# Patient Record
Sex: Male | Born: 1935 | Race: Black or African American | Hispanic: No | Smoking: Never smoker
Health system: Southern US, Community
[De-identification: ages and names within clinical notes are randomized; demographics above are authoritative.]

## PROBLEM LIST (undated history)

## (undated) DIAGNOSIS — I10 Essential (primary) hypertension: Secondary | ICD-10-CM

## (undated) DIAGNOSIS — R42 Dizziness and giddiness: Secondary | ICD-10-CM

## (undated) DIAGNOSIS — I4891 Unspecified atrial fibrillation: Secondary | ICD-10-CM

## (undated) DIAGNOSIS — N390 Urinary tract infection, site not specified: Secondary | ICD-10-CM

## (undated) HISTORY — PX: PACEMAKER INSERTION: SHX728

## (undated) HISTORY — PX: HERNIA REPAIR: SHX51

---

## 2016-08-03 DIAGNOSIS — R339 Retention of urine, unspecified: Secondary | ICD-10-CM | POA: Insufficient documentation

## 2016-09-11 DIAGNOSIS — I10 Essential (primary) hypertension: Secondary | ICD-10-CM | POA: Insufficient documentation

## 2016-09-11 DIAGNOSIS — K219 Gastro-esophageal reflux disease without esophagitis: Secondary | ICD-10-CM | POA: Insufficient documentation

## 2016-09-11 DIAGNOSIS — E785 Hyperlipidemia, unspecified: Secondary | ICD-10-CM | POA: Insufficient documentation

## 2016-09-11 DIAGNOSIS — I2510 Atherosclerotic heart disease of native coronary artery without angina pectoris: Secondary | ICD-10-CM | POA: Insufficient documentation

## 2017-08-27 DIAGNOSIS — R001 Bradycardia, unspecified: Secondary | ICD-10-CM | POA: Insufficient documentation

## 2017-08-30 DIAGNOSIS — R42 Dizziness and giddiness: Secondary | ICD-10-CM | POA: Insufficient documentation

## 2018-02-24 DIAGNOSIS — I442 Atrioventricular block, complete: Secondary | ICD-10-CM | POA: Insufficient documentation

## 2018-04-28 DIAGNOSIS — Z9079 Acquired absence of other genital organ(s): Secondary | ICD-10-CM | POA: Insufficient documentation

## 2018-09-26 DIAGNOSIS — Z950 Presence of cardiac pacemaker: Secondary | ICD-10-CM | POA: Insufficient documentation

## 2019-06-20 ENCOUNTER — Telehealth: Payer: Self-pay | Admitting: Nurse Practitioner

## 2019-06-20 NOTE — Telephone Encounter (Signed)
Spoke with patient's daughter Geralynn Rile, regarding Palliative services and she was in agreement with this.  I have scheduled a Telephone Consult for 07/10/19 @ 12:30 PM.

## 2019-07-10 ENCOUNTER — Other Ambulatory Visit: Payer: Self-pay

## 2019-07-10 ENCOUNTER — Other Ambulatory Visit: Payer: Medicare Other | Admitting: Nurse Practitioner

## 2019-07-10 ENCOUNTER — Encounter: Payer: Self-pay | Admitting: Nurse Practitioner

## 2019-07-10 DIAGNOSIS — Z515 Encounter for palliative care: Secondary | ICD-10-CM

## 2019-07-10 NOTE — Progress Notes (Signed)
[facility] Consult Note Telephone: [phone]  Fax: [phone]  PATIENT NAME: Gregg Case. DOB: May 01, 1933 MRN: [id number]  PRIMARY CARE PROVIDER:   Vidal Schwalbe, MD  REFERRING PROVIDER:  Vidal Schwalbe, MD [address] [place],  [place] [address]  RESPONSIBLE PARTY:  Self; daughter Dustan Hyams   Due to the COVID-19 crisis, this visit was done via telemedicine from my office and it was initiated and consent by this patient and or family  I was asked by Dr Bartolo Darter to see [name] for Palliative care consult for [place].   RECOMMENDATIONS and PLAN:  1. ACP; discussed code status and wishes to remain a full code. Agreeable to have blank MOST form and hard choice book to be mailed for review and will revisit at next [facility] visit  2. Palliative care encounter Palliative medicine team will continue to support patient, patient's family, and medical team. Visit consisted of counseling and education dealing with the complex and emotionally intense issues of symptom management and palliative care in the setting of serious and potentially life-threatening illness  I spent 65 minutes providing this consultation,  from 12:30pm to 1:35pm. More than 50% of the time in this consultation was spent coordinating communication.   HISTORY OF PRESENT ILLNESS:  Gregg Case. is a 85 y.o. year old male with multiple medical problems including .History of non-obstructive CAD, bradycardia s/p pacemaker, hypertension, hyperlipidemia, BPH s/p TURP 2018, osteoarthritis, trochanteric bursitis left hip, chronic pain for which he uses Voltaren gel. [facility] note from 46 / 82 / 2019 for face-to-face visit for a hospital bed as  he was not capable of rolling and positioning himself in the bed. Chronic pain which requires assistance with position changes. Use of hospital bed secondary to Patient stability. On 7 / 22 / 2020 bilateral inguinal  hernia masses that he had for 8 months, Dr Iona Beard, Surgeon found in legal hernia Sac with ascites fluid on interim medial surface of the left spermatic cord, lateral to the inferior epigastric blood vessel. A large indirect inguinal hernia Sac extending to the right scrotum it contained colon. Hospice evaluated 6 / 28 / 2020 with physical condition and function declined in the last few months. A walking cane to ambulate. Incontinent bladder, wears adult pull-ups. Able to do most adl's, able to make  himself something to eat, going to the bathroom. He does live with his son. Appetite good, no weight loss, weight 08/2017 weight 186 lbs and 8 / 28 / . I called [name] and his daughter Gregg Case for scheduled telemedicine telephonic palliative care initial visit. Gregg Case and [name] both in agreement. We talked about past medical history in the setting of chronic disease. We talked about natural age progression. We talked about the last time Mr Gales was independent at home, taking care of himself. Gregg Case endorses he does continue to drive, complete his ADL though has had significant difficulty with stability. It takes him a long time to do is pass. He is incontinent of urine. [name] does wear adult diapers. He needs himself and appetite has been good no recent weight loss. 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 We talked about Life review and family dynamics. 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 We talked about stability and limitations. Want to talk about the hospital bed that he received from insurance which is  very uncomfortable and causing him pain with the mattress. We talked about looking into seeing if that can be replaced. Will give test to nurse Navigator palliative care to see if that is an option. We talked about symptoms of pain and shortness of breath what she does not exhibit. We talked about quality of  life. We talked about medical goals of care including aggressive versus conservative versus Comfort Care. Gregg Case endorses that during hospitalization his wishes were initially to be in DNR and then he decided later in the hospitalization that he would want CPR. So he remained a full code. We talked about different scenarios with aggressive interventions including CPR. We talked about the most corpsman hard Choice book. Gregg Case [name] blitter open to reviewing both and will mail copy re-visit at next palliative care visit. We talked about role of palliative care and plan of care. Discuss that will follow up in 4 weeks if needed as he does appear stable or sooner should he declined. Mr. Kuan and Gregg Case both in agreement. Questions answered the satisfaction. Contact information provided. Therapeutic listening and emotional support provided.  Palliative Care was asked to help address goals of care.   CODE STATUS: Full code  PPS: 60% HOSPICE ELIGIBILITY/DIAGNOSIS: TBD  PAST MEDICAL HISTORY: No past medical history on file.  SOCIAL HX:  Social History   Tobacco Use   Smoking status: Not on file  Substance Use Topics   Alcohol use: Not on file    ALLERGIES: Not on File   PERTINENT MEDICATIONS:  No outpatient encounter medications on file as of 07/10/2019.   No facility-administered encounter medications on file as of 07/10/2019.     PHYSICAL EXAM:   Deferred  Masoud Nyce Z Mike Berntsen, NP

## 2019-07-17 ENCOUNTER — Telehealth: Payer: Self-pay

## 2019-07-17 NOTE — Telephone Encounter (Signed)
Phone call placed to PCP office at the request of Palliative NP to request an order for hospital bed. Spoke with Sharyn Lull to request this.

## 2019-08-12 ENCOUNTER — Other Ambulatory Visit: Payer: Self-pay

## 2019-08-12 ENCOUNTER — Other Ambulatory Visit: Payer: Medicare Other | Admitting: Nurse Practitioner

## 2019-09-01 ENCOUNTER — Other Ambulatory Visit: Payer: Medicare Other | Admitting: Primary Care

## 2019-09-01 ENCOUNTER — Other Ambulatory Visit: Payer: Self-pay

## 2019-09-01 DIAGNOSIS — Z515 Encounter for palliative care: Secondary | ICD-10-CM

## 2019-09-01 NOTE — Progress Notes (Signed)
[facility] Consult Note Telephone: [phone]  Fax: [phone]   PATIENT NAME: Gregg Case. [address] [place] [address] [phone] (home)  DOB: Jun 19, 1933 MRN: [id number]  PRIMARY CARE PROVIDER:   Vidal Schwalbe, MD, [address] [place] [address] [phone]  REFERRING PROVIDER:  Vidal Schwalbe, MD [address] [place],  [place] [address] [phone]  RESPONSIBLE PARTY:   Extended Emergency Contact Information Primary Emergency Contact: Geralynn Case Home Phone: [phone] Relation: Daughter  Mr. Reginold Agent: I met with him and his daughter in his home.   ASSESSMENT AND RECOMMENDATIONS:   1. Advance Care Planning/Goals of Care: Goals include to maximize quality of life and symptom management. Advanced directives we discussed who would help him with his healthcare decisions; his daughter who was in attendance states it is she. He states they have talked a little bit about what he wanted . He states at the hospital they asked and he said he didn't want resuscitation the last time he was in the hospital but has now changed his mind. I went over the most form with them and ask them to have a family discussion about wishes and we would discuss on my next visit with them. I will also clarify any questions at that time.   2. Symptom Management:   Urination Alteration in urinary function: Pt described his main issue as being his incontinence. He realizes he's taking two medication's per scar in [place] to help him empty his bladder well. He's not apparently on any anti-spasmodic currently. He is seen by [facility] urology who has offered him a suprapubic catheter. We discussed how he had attempted to do some self cath but got an infection. I was unclear as to the timeline. We did discuss a Foley catheter and perhaps he should trial that before the suprapubic. I asked him to make a follow up appointment with [facility]  urology to explore additional medication options and to discuss this ongoing impact to his quality of life.   Pain:  he complains of a lot of back pain and is just taking OTC's for this. He did complain about his bed being very uncomfortable and I went and inspected it. The bed springs are pushing up to the surface of the mattress so that it would be quite uncomfortable. He also has a piece of plastic over the for comfort and breathability. I have a call put into [facility] in [place] that he obtained the bed from. I am awaiting their call to hopefully rectify and get him a better mattress from [facility] on [address], [phone]  Mobility: He states that he is living in his own home with his son who works during the day but his home at night. He enjoys walking on his property and states he is driving.  3. Family /Caregiver/Community Supports: Lives with son in own home. Wife has passed. Daughter lives in area and helps too.  4. Cognitive / Functional decline:  Alert and oriented x 3. Has fair health literacy. Able to ambulate in home and states he is driving.   5. Follow up Palliative Care Visit: Palliative care will continue to follow for goals of care clarification and symptom management. Return 8 weeks or prn.  I spent 60 minutes providing this consultation,  from 1430 to 1530. More than 50% of the time in this consultation was spent coordinating communication.   HISTORY OF PRESENT  ILLNESS:  Gregg Case. is a 85 y.o. year old male with multiple medical problems including chronic pain, CAD, GERD, HTN, BPH, urge incontinence, HLD. Palliative Care was asked to follow this patient by consultation request of Vidal Schwalbe, MD to help address advance care planning and goals of care. This is a follow up visit.  CODE STATUS: FULL  PPS: 50% HOSPICE ELIGIBILITY/DIAGNOSIS: no  PAST MEDICAL HISTORY:chronic pain, CAD, GERD, HTN, BPH, urge incontinence,  HLD SOCIAL HX:  Social History   Tobacco Use  . Smoking status: Not on file  Substance Use Topics  . Alcohol use: Not on file    ALLERGIES: No Known Allergies   PERTINENT MEDICATIONS:  Outpatient Encounter Medications as of 09/01/2019  Medication Sig  . amLODipine (NORVASC) 5 MG tablet Take 5 mg by mouth daily.  Marland Kitchen apixaban (ELIQUIS) 5 MG TABS tablet Take 5 mg by mouth 2 (two) times daily.  Marland Kitchen atorvastatin (LIPITOR) 40 MG tablet Take 40 mg by mouth daily.  . cetirizine (ZYRTEC) 10 MG tablet Take 10 mg by mouth daily.  . finasteride (PROSCAR) 5 MG tablet Take 5 mg by mouth daily.  Marland Kitchen lisinopril (ZESTRIL) 20 MG tablet Take 20 mg by mouth 2 (two) times daily.  . pantoprazole (PROTONIX) 40 MG tablet Take 40 mg by mouth 2 (two) times daily.  . sucralfate (CARAFATE) 1 g tablet Take 1 g by mouth 2 (two) times daily.  . tamsulosin (FLOMAX) 0.4 MG CAPS capsule Take 0.8 mg by mouth.   No facility-administered encounter medications on file as of 09/01/2019.     PHYSICAL EXAM / ROS:   Current and past weights: current wt unavailable. 2.5 years ago recorded as 203 (92.2 kg), ht is 190 cm, States clothes were loose but getting tighter again. General: NAD, frail appearing, WNWD Cardiovascular: no chest pain reported, no edema  Pulmonary: no cough, no increased SOB, room air Abdomen: appetite good, denies constipation, continent of bowel GU: denies dysuria, incontinent of urine, incomplete bladder emptying MSK:  no joint deformities, ambulatory, no falls Skin: no rashes or wounds reported Neurological: Weakness, endorses back pain  Jason Coop, NP [facility]  COVID-19 PATIENT SCREENING TOOL  Person answering questions: ________[name]___________ _____   1.  Is the patient or any family member in the home showing any signs or symptoms regarding respiratory infection?               Person with Symptom- ____na_______________________  a. Fever                                                                           Yes___ No___          ___________________  b. Shortness of breath                                                    Yes___ No___          ___________________ c. Cough/congestion  Yes___  No___         ___________________ d. Body aches/pains                                                         Yes___ No___        ____________________ e. Gastrointestinal symptoms (diarrhea, nausea)           Yes___ No___        ____________________  2. Within the past 14 days, has anyone living in the home had any contact with someone with or under investigation for COVID-19?    Yes___ No_x_   Person __________________

## 2019-09-04 ENCOUNTER — Other Ambulatory Visit: Payer: Medicare Other | Admitting: Nurse Practitioner

## 2019-09-30 ENCOUNTER — Emergency Department (HOSPITAL_COMMUNITY): Payer: Medicare PPO

## 2019-09-30 ENCOUNTER — Emergency Department (HOSPITAL_COMMUNITY)
Admission: EM | Admit: 2019-09-30 | Discharge: 2019-09-30 | Disposition: A | Discharge status: Home / Self Care | Payer: Medicare PPO | Attending: Emergency Medicine | Admitting: Emergency Medicine

## 2019-09-30 ENCOUNTER — Other Ambulatory Visit: Payer: Self-pay

## 2019-09-30 ENCOUNTER — Encounter (HOSPITAL_COMMUNITY): Payer: Self-pay | Admitting: Emergency Medicine

## 2019-09-30 DIAGNOSIS — N3001 Acute cystitis with hematuria: Secondary | ICD-10-CM | POA: Insufficient documentation

## 2019-09-30 DIAGNOSIS — Z950 Presence of cardiac pacemaker: Secondary | ICD-10-CM | POA: Insufficient documentation

## 2019-09-30 DIAGNOSIS — R339 Retention of urine, unspecified: Secondary | ICD-10-CM | POA: Diagnosis not present

## 2019-09-30 DIAGNOSIS — I10 Essential (primary) hypertension: Secondary | ICD-10-CM | POA: Insufficient documentation

## 2019-09-30 DIAGNOSIS — I4891 Unspecified atrial fibrillation: Secondary | ICD-10-CM | POA: Diagnosis not present

## 2019-09-30 DIAGNOSIS — Z79899 Other long term (current) drug therapy: Secondary | ICD-10-CM | POA: Diagnosis not present

## 2019-09-30 DIAGNOSIS — Z7901 Long term (current) use of anticoagulants: Secondary | ICD-10-CM | POA: Insufficient documentation

## 2019-09-30 DIAGNOSIS — R1110 Vomiting, unspecified: Secondary | ICD-10-CM | POA: Diagnosis present

## 2019-09-30 DIAGNOSIS — E876 Hypokalemia: Secondary | ICD-10-CM | POA: Insufficient documentation

## 2019-09-30 HISTORY — DX: Dizziness and giddiness: R42

## 2019-09-30 HISTORY — DX: Essential (primary) hypertension: I10

## 2019-09-30 HISTORY — DX: Urinary tract infection, site not specified: N39.0

## 2019-09-30 HISTORY — DX: Unspecified atrial fibrillation: I48.91

## 2019-09-30 LAB — URINALYSIS, ROUTINE W REFLEX MICROSCOPIC
Bilirubin Urine: NEGATIVE
Glucose, UA: NEGATIVE mg/dL
Ketones, ur: NEGATIVE mg/dL
Nitrite: NEGATIVE
Protein, ur: 100 mg/dL — AB
RBC / HPF: >50 RBC/hpf — ABNORMAL HIGH (ref 0–5)
Specific Gravity, Urine: 1.015 (ref 1.005–1.030)
WBC, UA: >50 WBC/hpf — ABNORMAL HIGH (ref 0–5)
pH: 6 (ref 5.0–8.0)

## 2019-09-30 LAB — HEPATIC FUNCTION PANEL
ALT: 26 U/L (ref 0–44)
AST: 24 U/L (ref 15–41)
Albumin: 3.5 g/dL (ref 3.5–5.0)
Alkaline Phosphatase: 72 U/L (ref 38–126)
Bilirubin, Direct: 0.2 mg/dL (ref 0.0–0.2)
Indirect Bilirubin: 0.7 mg/dL (ref 0.3–0.9)
Total Bilirubin: 0.9 mg/dL (ref 0.3–1.2)
Total Protein: 6.9 g/dL (ref 6.5–8.1)

## 2019-09-30 LAB — CBC WITH DIFFERENTIAL/PLATELET
Abs Immature Granulocytes: 0.1 10*3/uL — ABNORMAL HIGH (ref 0.00–0.07)
Basophils Absolute: 0 10*3/uL (ref 0.0–0.1)
Basophils Relative: 0 %
Eosinophils Absolute: 0 10*3/uL (ref 0.0–0.5)
Eosinophils Relative: 0 %
HCT: 40.7 % (ref 39.0–52.0)
Hemoglobin: 13.2 g/dL (ref 13.0–17.0)
Immature Granulocytes: 1 %
Lymphocytes Relative: 5 %
Lymphs Abs: 0.5 10*3/uL — ABNORMAL LOW (ref 0.7–4.0)
MCH: 30.9 pg (ref 26.0–34.0)
MCHC: 32.4 g/dL (ref 30.0–36.0)
MCV: 95.3 fL (ref 80.0–100.0)
Monocytes Absolute: 0.7 10*3/uL (ref 0.1–1.0)
Monocytes Relative: 6 %
Neutro Abs: 10.3 10*3/uL — ABNORMAL HIGH (ref 1.7–7.7)
Neutrophils Relative %: 88 %
Platelets: 152 10*3/uL (ref 150–400)
RBC: 4.27 MIL/uL (ref 4.22–5.81)
RDW: 13.2 % (ref 11.5–15.5)
WBC: 11.7 10*3/uL — ABNORMAL HIGH (ref 4.0–10.5)
nRBC: 0 % (ref 0.0–0.2)

## 2019-09-30 LAB — BASIC METABOLIC PANEL
Anion gap: 9 (ref 5–15)
BUN: 18 mg/dL (ref 8–23)
CO2: 26 mmol/L (ref 22–32)
Calcium: 9 mg/dL (ref 8.9–10.3)
Chloride: 107 mmol/L (ref 98–111)
Creatinine, Ser: 1.15 mg/dL (ref 0.61–1.24)
GFR calc Af Amer: >60 mL/min (ref >60)
GFR calc non Af Amer: 58 mL/min — ABNORMAL LOW (ref >60)
Glucose, Bld: 97 mg/dL (ref 70–99)
Potassium: 3 mmol/L — ABNORMAL LOW (ref 3.5–5.1)
Sodium: 142 mmol/L (ref 135–145)

## 2019-09-30 LAB — LIPASE, BLOOD: Lipase: 14 U/L (ref 11–51)

## 2019-09-30 MED ORDER — POTASSIUM CHLORIDE CRYS ER 20 MEQ PO TBCR
40.0000 meq | EXTENDED_RELEASE_TABLET | Freq: Once | ORAL | Status: AC
  Administered 2019-09-30: 16:00:00 40 meq via ORAL
  Filled 2019-09-30: qty 2

## 2019-09-30 MED ORDER — CEPHALEXIN 500 MG PO CAPS: 500.0000 mg | ORAL_CAPSULE | Freq: Four times a day (QID) | ORAL | 0 refills | Status: AC

## 2019-09-30 MED ORDER — CEPHALEXIN 500 MG PO CAPS
500.0000 mg | ORAL_CAPSULE | Freq: Once | ORAL | Status: AC
  Administered 2019-09-30: 500 mg via ORAL
  Filled 2019-09-30: qty 1

## 2019-09-30 MED ORDER — POTASSIUM CHLORIDE CRYS ER 20 MEQ PO TBCR: 20.0000 meq | EXTENDED_RELEASE_TABLET | Freq: Two times a day (BID) | ORAL | 0 refills | Status: AC

## 2019-09-30 MED ORDER — ONDANSETRON HCL 4 MG/2ML IJ SOLN
4.0000 mg | Freq: Once | INTRAMUSCULAR | Status: AC
  Administered 2019-09-30: 4 mg via INTRAVENOUS
  Filled 2019-09-30: qty 2

## 2019-09-30 NOTE — ED Notes (Signed)
Leg bag placed and explained to pt on how to use

## 2019-09-30 NOTE — ED Triage Notes (Signed)
Per son, patient was complaining of chills and shaking last night. He also had one episode of vomiting last night. Patient complaining of left ear pain and was given antibiotics by PCP.

## 2019-09-30 NOTE — ED Notes (Addendum)
Pt admits to emesis x 1.  Pt requesting kidney function to be checked. Pt denies fevers at home.

## 2019-09-30 NOTE — ED Provider Notes (Signed)
[facility] EMERGENCY DEPARTMENT Provider Note   CSN: [id number] Arrival date & time: 09/30/19  1313     History Chief Complaint  Patient presents with  . Emesis    Gregg Case. is a 86 y.o. male with a history of A. fib on Eliquis, hypertension, history of enlarged prostate and UTI which sounds like it was provoked secondary to attempts at home self catheterization secondary to urinary retention presenting with a 1 day history of nausea, emesis x1 and shaking chills which occurred last night but denies having current symptoms.  He denies nausea today, also denies abdominal pain, chest pain, shortness of breath, cough.  He does report chronic postnasal drip requiring coughing up of clear sputum.  He also states had pain in his left ear last week and was placed on amoxicillin by his PCP,which he has completed.  No fevers, also denies constipation or diarrhea.  No known exposures to Covid.  He has had no medications for his presenting symptoms.  The history is provided by the patient, medical records and a relative (son per phone).       Past Medical History:  Diagnosis Date  . Atrial fibrillation ([place])   . Hypertension   . UTI (urinary tract infection)   . Vertigo     There are no problems to display for this patient.   Past Surgical History:  Procedure Laterality Date  . HERNIA REPAIR    . PACEMAKER INSERTION         History reviewed. No pertinent family history.  Social History   Tobacco Use  . Smoking status: Never Smoker  . Smokeless tobacco: Never Used  Substance Use Topics  . Alcohol use: Never  . Drug use: Never    Home Medications Prior to Admission medications   Medication Sig Start Date End Date Taking? Authorizing Provider  amLODipine (NORVASC) 5 MG tablet Take 5 mg by mouth daily.    [provider]  apixaban (ELIQUIS) 5 MG TABS tablet Take 5 mg by mouth 2 (two) times daily.    [provider]  atorvastatin (LIPITOR) 40 MG  tablet Take 40 mg by mouth daily.    [provider]  cetirizine (ZYRTEC) 10 MG tablet Take 10 mg by mouth daily.    [provider]  finasteride (PROSCAR) 5 MG tablet Take 5 mg by mouth daily.    [provider]  lisinopril (ZESTRIL) 20 MG tablet Take 20 mg by mouth 2 (two) times daily.    [provider]  pantoprazole (PROTONIX) 40 MG tablet Take 40 mg by mouth 2 (two) times daily.    [provider]  sucralfate (CARAFATE) 1 g tablet Take 1 g by mouth 2 (two) times daily.    [provider]  tamsulosin (FLOMAX) 0.4 MG CAPS capsule Take 0.8 mg by mouth.    [provider]  traMADol (ULTRAM) 50 MG tablet Take 1 tablet by mouth 3 (three) times daily as needed. 08/27/19   [provider]    Allergies    Patient has no known allergies.  Review of Systems   Review of Systems  Constitutional: Positive for chills. Negative for fever.  HENT: Negative for congestion and sore throat.   Eyes: Negative.   Respiratory: Negative for chest tightness and shortness of breath.   Cardiovascular: Negative for chest pain.  Gastrointestinal: Positive for nausea and vomiting. Negative for abdominal distention and abdominal pain.  Genitourinary: Positive for enuresis. Negative for decreased urine volume,  dysuria and hematuria.  Musculoskeletal: Negative for arthralgias, joint swelling and neck pain.  Skin: Negative.  Negative for rash and wound.  Neurological: Negative for dizziness, weakness, light-headedness, numbness and headaches.  Psychiatric/Behavioral: Negative.     Physical Exam Updated Vital Signs BP (!) 142/72   Pulse 64   Temp 99.3 F (37.4 C) (Oral)   Ht 6\' 2"  (1.88 m)   Wt 81.6 kg   SpO2 96%   BMI 23.11 kg/m   Physical Exam Vitals and nursing note reviewed.  Constitutional:      Appearance: He is well-developed.  HENT:     Head: Normocephalic and atraumatic.  Eyes:     Conjunctiva/sclera: Conjunctivae normal.    Cardiovascular:     Rate and Rhythm: Normal rate and regular rhythm.     Heart sounds: Normal heart sounds.  Pulmonary:     Effort: Pulmonary effort is normal.     Breath sounds: Normal breath sounds. No wheezing.  Abdominal:     General: Bowel sounds are normal.     Palpations: Abdomen is soft.     Tenderness: There is no abdominal tenderness.  Musculoskeletal:        General: Normal range of motion.     Cervical back: Normal range of motion.  Skin:    General: Skin is warm and dry.  Neurological:     Mental Status: He is alert.     ED Results / Procedures / Treatments   Labs (all labs ordered are listed, but only abnormal results are displayed) Labs Reviewed  CBC WITH DIFFERENTIAL/PLATELET - Abnormal; Notable for the following components:      Result Value   WBC 11.7 (*)    Neutro Abs 10.3 (*)    Lymphs Abs 0.5 (*)    Abs Immature Granulocytes 0.10 (*)    All other components within normal limits  BASIC METABOLIC PANEL - Abnormal; Notable for the following components:   Potassium 3.0 (*)    GFR calc non Af Amer 58 (*)    All other components within normal limits  HEPATIC FUNCTION PANEL  LIPASE, BLOOD  URINALYSIS, ROUTINE W REFLEX MICROSCOPIC    EKG None  Radiology Acute Abd Series  Result Date: 09/30/2019 CLINICAL DATA:  Chills, nausea and vomiting. EXAM: DG ABDOMEN ACUTE W/ 1V CHEST COMPARISON:  None. FINDINGS: There is a dual lead AICD. There is no evidence of acute infiltrate, pleural effusion or pneumothorax. There is no evidence of dilated bowel loops or free intraperitoneal air. No radiopaque calculi or other significant radiographic abnormality is seen. There is marked severity tortuosity of the descending thoracic aorta. Heart size and mediastinal contours are within normal limits. Marked severity degenerative changes seen throughout the thoracolumbar spine. IMPRESSION: 1. No acute or active cardiopulmonary disease. 2. No evidence of bowel obstruction.  Electronically Signed   By: 11/28/2019 M.D.   On: 09/30/2019 15:35    Procedures Procedures (including critical care time)  Medications Ordered in ED Medications  ondansetron (ZOFRAN) injection 4 mg (4 mg Intravenous Given 09/30/19 1459)  potassium chloride SA (KLOR-CON) CR tablet 40 mEq (40 mEq Oral Given 09/30/19 1629)    ED Course  I have reviewed the triage vital signs and the nursing notes.  Pertinent labs & imaging results that were available during my care of the patient were reviewed by me and considered in my medical decision making (see chart for details).    MDM Rules/Calculators/A&P  Labs reviewed and unremarkable except for hypokalemia at 3.0.  He was ordered p.o. supplementation of this.  He also does have a mild leukocytosis at 11.7.  Currently the patient has only been able to give [place] a small sample of urine, roughly 20 cc, however a bladder scan indicates 422 cc of urine, concerning for urinary retention.  Foley catheter was ordered.  Pending urinalysis results at this time.  The urine sample is dark and cloudy, concern for possible UTI.  Patient may be best served with going home with a Foley catheter with close urology follow-up given concern for your significant urinary retention.  Discussed with Gregg Aus, PA-C who assumes care of patient. Final Clinical Impression(s) / ED Diagnoses Final diagnoses:  Urinary retention  Hypokalemia    Rx / DC Orders ED Discharge Orders    None       Victoriano Lain 09/30/19 1705    Raeford Razor, MD 10/03/19 1007

## 2019-09-30 NOTE — ED Notes (Signed)
Patient's son states he will wait in car and wants to be contacted if needed. Name is Kervin Bones, number is [phone].

## 2019-09-30 NOTE — ED Notes (Signed)
Patient transported to X-ray 

## 2019-09-30 NOTE — Discharge Instructions (Addendum)
Your urine test today shows that you have a urinary tract infection.  Please take the antibiotic as directed until it is finished.  You also have some urinary retention, so the Foley catheter will be left in place till you can follow-up with your urologist this week.  Your potassium level today was also low.  You may take the potassium supplements as directed for 5 days.  You may have your primary provider to recheck your potassium level in a week.  Return to the emergency room for any worsening symptoms.

## 2019-09-30 NOTE — ED Provider Notes (Signed)
   Patient is a 86 year old male with history of atrial fibrillation and anticoagulated on Eliquis, enlarged prostate and urinary tract infection.  He comes to the emergency department with a 1 day history of chills and one episode of vomiting.  He was noted to have urinary retention and a Foley catheter was ordered.  Urinalysis and laboratory studies were pending at end of shift, signed out by Burgess Amor, PA-C  Patient reports feeling better after insertion of Foley catheter.  Catheter is draining well and approximately 450 cc of dark urine in the Foley bag.  Patient appears appropriate for discharge home, urine culture is pending.  Will start on Keflex.  I have spoken with the patient's daughter and son and explained follow-up instructions.  They agree to follow-up with patient's urologist this week.  Will also start him on short course of potassium for his hypokalemia.     Labs Reviewed  CBC WITH DIFFERENTIAL/PLATELET - Abnormal; Notable for the following components:      Result Value   WBC 11.7 (*)    Neutro Abs 10.3 (*)    Lymphs Abs 0.5 (*)    Abs Immature Granulocytes 0.10 (*)    All other components within normal limits  BASIC METABOLIC PANEL - Abnormal; Notable for the following components:   Potassium 3.0 (*)    GFR calc non Af Amer 58 (*)    All other components within normal limits  URINALYSIS, ROUTINE W REFLEX MICROSCOPIC - Abnormal; Notable for the following components:   Color, Urine AMBER (*)    APPearance CLOUDY (*)    Hgb urine dipstick LARGE (*)    Protein, ur 100 (*)    Leukocytes,Ua LARGE (*)    RBC / HPF >50 (*)    WBC, UA >50 (*)    Bacteria, UA RARE (*)    All other components within normal limits  URINE CULTURE  HEPATIC FUNCTION PANEL  LIPASE, BLOOD      Pauline Aus, PA-C 09/30/19 1843    Bethann Berkshire, MD 10/01/19 1038

## 2019-10-02 LAB — URINE CULTURE: Culture: <10000 — AB

## 2019-10-09 ENCOUNTER — Telehealth: Payer: Self-pay | Admitting: Primary Care

## 2019-10-09 NOTE — Telephone Encounter (Signed)
T/c from Daughter Santina Evans about visit tomorrow. She gave me some history about his recent urology work up and issues. They are determining what path to follow to deal with incontinence. In addition, his bed with the sprung springs was not replaced by [facility]. I phoned and left another message RE replacing the mattress. Santina Evans would like me to phone tomorrow during my visit to have her on speaker.

## 2019-10-10 ENCOUNTER — Other Ambulatory Visit: Payer: Medicare PPO | Admitting: Primary Care

## 2019-10-10 ENCOUNTER — Other Ambulatory Visit: Payer: Self-pay

## 2019-10-14 ENCOUNTER — Telehealth: Payer: Self-pay | Admitting: Primary Care

## 2019-10-14 NOTE — Telephone Encounter (Signed)
I made a call on behalf of patient to [employer] on October 10, 2019,voice number [phone] to find out about the bed mattress. This mattress has been defective for some months with the spring pushing up. They stated that they had not received my calls asking for a replacement in the past.   However Mr. Felmlee is now eligible for a new mattress which is available through Medicare yearly. His date of original purchase was 09-19-18 so he can get a new bed mattress now. PCP should send order for the new mattress to fit the existing bed. Please also order a gel overlay.  He has new insurance for 2021 which needs to also be updated and included. I have asked the family to obtain a copy of his Medicare card front and back for this purpose. There need to be notes as to why he needs the new mattress. I am happy to document this on my next visit if a face-to-face is needed. Otherwise he needs the mattress because the existing one is damaged and has bedsprings dislodged and pushing up on the  mattress surface. The DME company did not return my calls to replace the damaged one during the first year.   Fax number to send this order is [phone]. I spoke with Amil Amen at [facility] initially, and then [place]. Follow up with Fleet Contras for any problems.

## 2019-10-16 ENCOUNTER — Other Ambulatory Visit: Payer: Self-pay

## 2019-10-16 ENCOUNTER — Other Ambulatory Visit: Payer: Medicare PPO | Admitting: Primary Care

## 2019-10-16 DIAGNOSIS — Z515 Encounter for palliative care: Secondary | ICD-10-CM

## 2019-10-16 NOTE — Progress Notes (Signed)
[facility] Consult Note Telephone: [phone]  Fax: [phone]  PATIENT NAME: Gregg Case. [address] [place] [address] [phone] (home)  DOB: 08/26/1933 MRN: [id number]  PRIMARY CARE PROVIDER:   Vidal Schwalbe, MD, [address] [place] [address] [phone]  REFERRING PROVIDER:  Vidal Schwalbe, MD [address] [place],  [place] [address] [phone]  RESPONSIBLE PARTY:   Extended Emergency Contact Information Primary Emergency Contact: Gregg Case Home Phone: [phone] Relation: Daughter Secondary Emergency Contact: Gregg Case Mobile Phone: [phone] Relation: Son  ASSESSMENT AND RECOMMENDATIONS:   1. Advance Care Planning/Goals of Care: Goals include to maximize quality of life and symptom management. Met with patient today, no other family stayed with [place] during interview. States he has had some urology issues and has been in and out of [place], [place] and [place]. Reminded of MOST form I left, they have not reviewed yet. Left copies for them to review.  2. Symptom Management:    Urine function: States some s/sx rigors recently, was treated for this last week. Had a cysto last week. States he is taking medications to help with urination.Daughter states he has had some catheter leakage but it's better now.  Constipation: Has miralax and a new Bullet for juicing. Daughter encourages him to use it.  Pain: In left hip, states chronic hip and back pain. Likely arthritic. Uses linaments on it and it helps. Occ takes tramadol.Suggested acetaminophen as well.   Skin Cream: Needs to obtain for flaking skin. Daughter understands which product to get.   3. Family /Caregiver/Community Supports: Lives in own home with son and other children helping.   4. Cognitive / Functional decline:  At cognitive baseline. Able to do alds and is driving.    5. Follow up Palliative Care Visit:  Palliative care will continue to follow for goals of care clarification and symptom management. Return 6 weeks or prn.  I spent 60 minutes providing this consultation,  from 1515 to 1615. More than 50% of the time in this consultation was spent coordinating communication.   HISTORY OF PRESENT ILLNESS:  Gregg Case. is a 85 y.o. year old male with multiple medical problems including chronic pain, CAD, GERD, HTN, BPH, urge incontinence, HLD. Palliative Care was asked to follow this patient by consultation request of Vidal Schwalbe, MD to help address advance care planning and goals of care. This is a follow up visit.   CODE STATUS: TBD  PPS: 40% HOSPICE ELIGIBILITY/DIAGNOSIS: TBD  PAST MEDICAL HISTORY:  Past Medical History:  Diagnosis Date  . Atrial fibrillation ([place])   . Hypertension   . UTI (urinary tract infection)   . Vertigo     SOCIAL HX:  Social History   Tobacco Use  . Smoking status: Never Smoker  . Smokeless tobacco: Never Used  Substance Use Topics  . Alcohol use: Never    ALLERGIES: No Known Allergies   PERTINENT MEDICATIONS:  Outpatient Encounter Medications as of 10/16/2019  Medication Sig  . amLODipine (NORVASC) 5 MG tablet Take 5 mg by mouth daily.  Marland Kitchen apixaban (ELIQUIS) 5 MG TABS tablet Take 5 mg by mouth 2 (two) times daily.  Marland Kitchen atorvastatin (LIPITOR) 40 MG tablet Take 40 mg by mouth daily.  . cephALEXin (KEFLEX) 500 MG capsule Take 1 capsule (500 mg total) by mouth 4 (four) times daily.  . cetirizine (ZYRTEC) 10 MG tablet Take 10 mg by mouth daily.  . finasteride (PROSCAR) 5 MG tablet  Take 5 mg by mouth daily.  Marland Kitchen lisinopril (ZESTRIL) 20 MG tablet Take 20 mg by mouth 2 (two) times daily.  . pantoprazole (PROTONIX) 40 MG tablet Take 40 mg by mouth 2 (two) times daily.  . potassium chloride SA (KLOR-CON) 20 MEQ tablet Take 1 tablet (20 mEq total) by mouth 2 (two) times daily.  . sucralfate (CARAFATE) 1 g tablet Take 1 g by mouth 2 (two) times daily.  .  tamsulosin (FLOMAX) 0.4 MG CAPS capsule Take 0.8 mg by mouth.  . traMADol (ULTRAM) 50 MG tablet Take 1 tablet by mouth 3 (three) times daily as needed.   No facility-administered encounter medications on file as of 10/16/2019.    PHYSICAL EXAM / ROS:   Current and past weights: States he feels he is stable with weight  General: NAD, frail appearing,  WNWD Cardiovascular: no chest pain reported, no edema Pulmonary: no cough, no increased SOB, room air Abdomen: appetite good, endorses constipation, continent of bowel GU: denies dysuria, incontinent of urine, incomplete emptying MSK:  no joint deformities, ambulatory with cane in home, Has a walker. Prefers cane. Denies falls.  Skin: no rashes or wounds reported, dryness of LE due to sl edema. Has inexpensive lotion, needs cream for skin. Neurological: Weakness,  Chronic pain reported  Jason Coop, NP  COVID-19 PATIENT SCREENING TOOL  Person answering questions: ________Self___________ _____   1.  Is the patient or any family member in the home showing any signs or symptoms regarding respiratory infection?               Person with Symptom- ___________________________  a. Fever                                                                          Yes___ No___          ___________________  b. Shortness of breath                                                    Yes___ No___          ___________________ c. Cough/congestion                                       Yes___  No___         ___________________ d. Body aches/pains                                                         Yes___ No___        ____________________ e. Gastrointestinal symptoms (diarrhea, nausea)           Yes___ No___        ____________________  2. Within the past 14 days, has anyone living in the home had any contact with someone with or under  investigation for COVID-19?    Yes___ No_x_   Person __________________

## 2019-10-27 ENCOUNTER — Telehealth: Payer: Self-pay | Admitting: Primary Care

## 2019-10-27 NOTE — Telephone Encounter (Signed)
T/c to home to check on the mattress. No delivery yet, will call pcp and request.

## 2019-11-04 ENCOUNTER — Other Ambulatory Visit: Payer: Medicare Other | Admitting: Primary Care

## 2019-11-25 ENCOUNTER — Telehealth: Payer: Self-pay | Admitting: Primary Care

## 2019-11-25 NOTE — Telephone Encounter (Signed)
T/c to home to schedule palliative visit. No answer, no message left.

## 2019-12-04 ENCOUNTER — Telehealth: Payer: Self-pay | Admitting: Primary Care

## 2019-12-04 NOTE — Telephone Encounter (Signed)
T/c to family, mattress is still not received. Call to PCP office to inquire RE order for new mattress. Spoke with Dr Bryna Colander who will follow up . Patient has yearly benefit for new mattress for his hospital bed. He just needs the mattress due to spring malfunction in his current bed. We've sent requests for this to [facility] multiple times.

## 2019-12-09 ENCOUNTER — Other Ambulatory Visit: Payer: Medicare PPO | Admitting: Primary Care

## 2019-12-09 ENCOUNTER — Other Ambulatory Visit: Payer: Self-pay

## 2019-12-09 DIAGNOSIS — Z515 Encounter for palliative care: Secondary | ICD-10-CM

## 2019-12-09 NOTE — Progress Notes (Signed)
Therapist, nutritional Palliative Care Consult Note Telephone: [phone]  Fax: [phone]    PATIENT NAME: Gregg Case. [address] [place] [address] [phone] (home)  DOB: [birth year] MRN: [id number]  PRIMARY CARE PROVIDER:   Emeka Lindner Robert, MD, [address] [place] [address] [phone]  REFERRING PROVIDER:  Rogena Deupree Robert, MD [address] [place],  [place] [address] [phone]  RESPONSIBLE PARTY:   Extended Emergency Contact Information Primary Emergency Contact: Carolyne Littles Home Phone: [phone] Relation: Daughter Secondary Emergency Contact: Coultas,Winfred Mobile Phone: [phone] Relation: Son   ASSESSMENT AND RECOMMENDATIONS:   1. Advance Care Planning/Goals of Care: Goals include to maximize quality of life and symptom management.  2. Symptom Management:   Pain: Comfort of the bed: Is still not replaced, have spoken to  MD. Recommend continued attempts to order mattress through DME company and to change providers if needed. States hip pain in left hip. He thinks it needs replacing.We discussed stretching muscles for pin relief.  He feels using a heating pad made the pain move to his back. Pain impacts his activities; he speaks of enjoying hunting but is too painful sometimes to go. Recommend tynelol arthritis for q 8 hrs. Otc.   Urology f/u: States he wants his keflex separate from his other pills, not sure why. Encouraged to take for prophylaxis of UTI. Note of 10/30/19 states they were going to make home health referral for catheter following. Have not been contacted.  Urology note  states plan would be CIC, Suprapubic or urethal. He does not want to do intermittent caths, and according to note has failed many TOV and gotten infections. He is accepting of chronic foley use.  3. Family /Caregiver/Community Supports:  Lives with son in own home. Family gives care. Is seen by [facility] and local PCP.   4.  Cognitive / Functional decline: Alert and oriented x 3. Ambulates with cane.  5. Follow up Palliative Care Visit: Palliative care will continue to follow for goals of care clarification and symptom management. Return 4-6 weeks or prn.  I spent 60  minutes providing this consultation,  from 1300 to 1400. More than 50% of the time in this consultation was spent coordinating communication.   HISTORY OF PRESENT ILLNESS:  Gregg Case. is a 86 y.o. year old male with multiple medical problems including chronic urinary retention, OA, afib pacemaker use. Palliative Care was asked to follow this patient by consultation request of [name], MD to help address advance care planning and goals of care. This is a follow up visit.  CODE STATUS: MOST given to children, have not returned yet.  PPS: 50% HOSPICE ELIGIBILITY/DIAGNOSIS: TBD  PAST MEDICAL HISTORY:  Past Medical History:  Diagnosis Date  . Atrial fibrillation ([facility])   . Hypertension   . UTI (urinary tract infection)   . Vertigo     SOCIAL HX:  Social History   Tobacco Use  . Smoking status: Never Smoker  . Smokeless tobacco: Never Used  Substance Use Topics  . Alcohol use: Never    ALLERGIES: No Known Allergies   PERTINENT MEDICATIONS:  Outpatient Encounter Medications as of 84/16/2021  Medication Sig  . amLODipine (NORVASC) 5 MG tablet Take 5 mg by mouth daily.  Marland Kitchen apixaban (ELIQUIS) 5 MG TABS tablet Take 5 mg by mouth 2 (two) times daily.  Marland Kitchen atorvastatin (LIPITOR) 40 MG tablet Take 40 mg by mouth daily.  . cephALEXin (KEFLEX) 500 MG capsule Take  1 capsule (500 mg total) by mouth 4 (four) times daily.  . cetirizine (ZYRTEC) 10 MG tablet Take 10 mg by mouth daily.  . finasteride (PROSCAR) 5 MG tablet Take 5 mg by mouth daily.  Marland Kitchen lisinopril (ZESTRIL) 20 MG tablet Take 20 mg by mouth 2 (two) times daily.  . pantoprazole (PROTONIX) 40 MG tablet Take 40 mg by mouth 2 (two) times daily.  . potassium chloride SA (KLOR-CON) 20  MEQ tablet Take 1 tablet (20 mEq total) by mouth 2 (two) times daily.  . sucralfate (CARAFATE) 1 g tablet Take 1 g by mouth 2 (two) times daily.  . tamsulosin (FLOMAX) 0.4 MG CAPS capsule Take 0.8 mg by mouth.  . traMADol (ULTRAM) 50 MG tablet Take 1 tablet by mouth 3 (three) times daily as needed.   No facility-administered encounter medications on file as of 12/09/2019.    PHYSICAL EXAM / ROS:   Current and past weights: WNL, not available.  General: NAD, frail appearing, WNWD Cardiovascular: no chest pain reported, no edema Pulmonary: no cough, no increased SOB Abdomen: appetite fair, endorses constipation, incontinent of bowel GU: denies dysuria, incontinent of urine MSK:  no joint deformities, ambulatory with cane Skin: no rashes or wounds reported Neurological: Weakness, A and O x 2-3, endorses poor sleep  Eliezer Lofts, NP [facility]  COVID-19 PATIENT SCREENING TOOL  Person answering questions: ____________Mr [name]____ _____   1.  Is the patient or any family member in the home showing any signs or symptoms regarding respiratory infection?               Person with Symptom- __________NA_________________  a. Fever                                                                          Yes___ No___          ___________________  b. Shortness of breath                                                    Yes___ No___          ___________________ c. Cough/congestion                                       Yes___  No___         ___________________ d. Body aches/pains                                                         Yes___ No___        ____________________ e. Gastrointestinal symptoms (diarrhea, nausea)           Yes___ No___        ____________________  2. Within the past 14 days, has anyone living in the home had any contact with someone  with or under investigation for COVID-19?    Yes___ No_X_   Person __________________

## 2019-12-31 ENCOUNTER — Telehealth: Payer: Self-pay | Admitting: Primary Care

## 2019-12-31 NOTE — Telephone Encounter (Signed)
Spoke with DME company and obtained fax for [employer]. I have resent the requested order for hospital bed mattress.

## 2020-01-05 ENCOUNTER — Telehealth: Payer: Self-pay | Admitting: Primary Care

## 2020-01-05 NOTE — Telephone Encounter (Signed)
Call from [employer] RE bed order. Sent additional documentation. Patient is served from the [employer]. Phone Is [phone].

## 2020-01-05 NOTE — Telephone Encounter (Signed)
Addendum note to document medical need for bed mattress. Current mattress has springs which have loosened and press into patient, causing poor sleep and discomfort from defective mattress. Patient need mattress for hospital bed for positioning for transferring and for pain management.

## 2020-02-03 ENCOUNTER — Telehealth: Payer: Self-pay | Admitting: Primary Care

## 2020-02-03 NOTE — Telephone Encounter (Signed)
T/c to POA to schedule visit. [name] daughter made appt for Friday 5/14 at noon.

## 2020-02-06 ENCOUNTER — Other Ambulatory Visit: Payer: Self-pay

## 2020-02-06 ENCOUNTER — Other Ambulatory Visit: Payer: Medicare PPO | Admitting: Primary Care

## 2020-02-06 DIAGNOSIS — Z515 Encounter for palliative care: Secondary | ICD-10-CM

## 2020-02-06 NOTE — Progress Notes (Signed)
[facility] Consult Note Telephone: [phone]  Fax: [phone]  PATIENT NAME: Gregg Case. [address] [place] [address] [phone] (home)  DOB: [birth year] MRN: [id number]  PRIMARY CARE PROVIDER:    Vidal Schwalbe, MD,  [address] [place] [address] [phone]  REFERRING PROVIDER:   Vidal Schwalbe, MD [address] [place],  [place] [address] [phone]  RESPONSIBLE PARTY:   Extended Emergency Contact Information Primary Emergency Contact: Gregg Case Home Phone: [phone] Relation: Daughter Secondary Emergency Contact: Gregg Case Mobile Phone: [phone] Relation: Son   I met with patient in the home.  ASSESSMENT AND RECOMMENDATIONS:   1. Advance Care Planning/Goals of Care: Goals include to maximize quality of life and symptom management. MOST was given to family, will f/u with POA.   2. Symptom Management:   Urinary Catheter: We discussed his catheter care this we have tried to find a home health agency who would change it but none are willing to take on the low utilization case that a monthly catheter change is. [facility]'s been [facility] 45 [facility] away for his monthly catheter changes. Education provided that monthly changes had may help to decrease infection although urinary infections are very expected with chronic catheter use. He describes a previous UTI with rigors which frightened him. He has not had a repeat of that in sometime.   Sinus headache/ Medication review: We discussed his medications specifically he complained of some frontal sinus headache. He had some amoxicillin left from a prescription in March and wondered if he should take it. Education provided that a course of antibiotics should be done altogether and advised not to take the two remaining pills. I did instruct him to use over-the-counter acetaminophen;  he also was prescribed Benadryl  which may be too sedating for him. He does not appear to have sinus infection, no URI or sinus impaction. Likely he is describing a chronic headache.   Mobility: Ambulation is and safe in the home he is not using a walker and holding onto furniture doorways etc. Education provided to use his walker for stability.He has finally received his replacement hospital bed mattress. He states it is very comfortable now and that he gets good rest. Encouraged to use a foam topper for bed if he would like more cushioning.  3. Family /Caregiver/Community Supports: [place]. Daughters also help, are POA. No additional services noted. Would benefit from MOW or PCS if available.  4. Cognitive / Functional decline: a and o x 3, lives with son who helps with adls, iadls. Pt has difficulty in ambulation, rising, mobility.  5. Follow up Palliative Care Visit: Palliative care will continue to follow for goals of care clarification and symptom management. Return 8-12 weeks or prn.  I spent 60 minutes providing this consultation,  from 1130 to 1230. More than 50% of the time in this consultation was spent coordinating communication.   HISTORY OF PRESENT ILLNESS:  Gregg Case. is a 86 y.o. year old male with multiple medical problems including afib, mobility, UTI and indwelling catheter, OA. Palliative Care was asked to follow this patient by consultation request of Vidal Schwalbe, MD to help address advance care planning and goals of care. This is a follow up visit.  CODE STATUS: TBD  PPS: 40% HOSPICE ELIGIBILITY/DIAGNOSIS: TBD  PAST MEDICAL HISTORY:  Past Medical History:  Diagnosis Date  . Atrial fibrillation ([place])   . Hypertension   .  UTI (urinary tract infection)   . Vertigo     SOCIAL HX:  Social History   Tobacco Use  . Smoking status: Never Smoker  . Smokeless tobacco: Never Used  Substance Use Topics  . Alcohol use: Never    ALLERGIES: No Known Allergies   PERTINENT  MEDICATIONS:  Outpatient Encounter Medications as of 02/06/2020  Medication Sig  . amLODipine (NORVASC) 5 MG tablet Take 5 mg by mouth daily.  Marland Kitchen apixaban (ELIQUIS) 5 MG TABS tablet Take 5 mg by mouth 2 (two) times daily.  Marland Kitchen atorvastatin (LIPITOR) 40 MG tablet Take 40 mg by mouth daily.  . cephALEXin (KEFLEX) 500 MG capsule Take 1 capsule (500 mg total) by mouth 4 (four) times daily.  . cetirizine (ZYRTEC) 10 MG tablet Take 10 mg by mouth daily.  . finasteride (PROSCAR) 5 MG tablet Take 5 mg by mouth daily.  Marland Kitchen lisinopril (ZESTRIL) 20 MG tablet Take 20 mg by mouth 2 (two) times daily.  . pantoprazole (PROTONIX) 40 MG tablet Take 40 mg by mouth 2 (two) times daily.  . potassium chloride SA (KLOR-CON) 20 MEQ tablet Take 1 tablet (20 mEq total) by mouth 2 (two) times daily.  . sucralfate (CARAFATE) 1 g tablet Take 1 g by mouth 2 (two) times daily.  . tamsulosin (FLOMAX) 0.4 MG CAPS capsule Take 0.8 mg by mouth.  . traMADol (ULTRAM) 50 MG tablet Take 1 tablet by mouth 3 (three) times daily as needed.   No facility-administered encounter medications on file as of 02/06/2020.    PHYSICAL EXAM / ROS:   Current and past weights: endorses some wt loss General: NAD, frail appearing, WNWD HEENT: Endorses sinus pain from allergies, no URI s/sx Cardiovascular: no chest pain reported, sl LE edema  Pulmonary: no cough, no increased SOB, room air Abdomen: appetite good, endorses occ constipation, continent of bowel GU: denies dysuria,catheter draining, has changed monthly MSK:  no joint and ROM abnormalities, ambulatory holding to furniture, needs cane or walker for safety Skin: no rashes or wounds reported Neurological: Weakness, a and o x 3, endorses occ msk pain  Gregg Coop, NP [facility]  COVID-19 PATIENT SCREENING TOOL  Person answering questions: ____________Self______ _____   1.  Is the patient or any family member in the home showing any signs or symptoms regarding respiratory  infection?               Person with Symptom- __________NA_________________  a. Fever                                                                          Yes___ No___          ___________________  b. Shortness of breath                                                    Yes___ No___          ___________________ c. Cough/congestion  Yes___  No___         ___________________ d. Body aches/pains                                                         Yes___ No___        ____________________ e. Gastrointestinal symptoms (diarrhea, nausea)           Yes___ No___        ____________________  2. Within the past 14 days, has anyone living in the home had any contact with someone with or under investigation for COVID-19?    Yes___ No_X_   Person __________________   

## 2020-05-24 ENCOUNTER — Telehealth: Payer: Self-pay

## 2020-05-24 NOTE — Telephone Encounter (Signed)
Attempted to contact patient. Received message that number is not in service.

## 2020-05-24 NOTE — Telephone Encounter (Signed)
Phone call placed to patient to check in and to offer to schedule a visit with Palliative NP. Attempted to leave message but was disconnected.

## 2020-07-12 ENCOUNTER — Telehealth: Payer: Self-pay | Admitting: Primary Care

## 2020-07-12 NOTE — Telephone Encounter (Signed)
T/c to POA to schedule palliative follow up. No answer, message left.

## 2020-07-22 ENCOUNTER — Other Ambulatory Visit: Payer: Self-pay

## 2020-07-22 ENCOUNTER — Other Ambulatory Visit: Payer: Medicare PPO | Admitting: Primary Care

## 2020-07-22 ENCOUNTER — Telehealth: Payer: Self-pay | Admitting: Primary Care

## 2020-07-22 DIAGNOSIS — R58 Hemorrhage, not elsewhere classified: Secondary | ICD-10-CM | POA: Insufficient documentation

## 2020-07-22 NOTE — Telephone Encounter (Signed)
T/c from POA, pt is in ED at  [facility]. Appt cancelled.

## 2020-07-26 ENCOUNTER — Telehealth: Payer: Self-pay | Admitting: Primary Care

## 2020-07-26 NOTE — Telephone Encounter (Signed)
I reached patient follow up at [facility] and asked them to follow up with his daughters to advise of any further medical intervention.

## 2020-07-26 NOTE — Telephone Encounter (Signed)
T/c from outreach T Darron Doom at [facility]. Has [facility] health who will f/u with wound care and suture removal. I will f/u per palliative care needs.

## 2020-07-26 NOTE — Telephone Encounter (Signed)
T/c from [place], pt daughter. She states pt has come home from hospital and needs dressing changed and stitches removed. She wants to know if palliative NP can do this. Referred to PCP for follow up from hospital for wound management and suture removal.

## 2020-08-06 ENCOUNTER — Telehealth: Payer: Self-pay | Admitting: Primary Care

## 2020-08-12 ENCOUNTER — Other Ambulatory Visit: Payer: Self-pay

## 2020-08-12 ENCOUNTER — Other Ambulatory Visit: Payer: Medicare PPO | Admitting: Primary Care

## 2020-08-12 DIAGNOSIS — I442 Atrioventricular block, complete: Secondary | ICD-10-CM

## 2020-08-12 DIAGNOSIS — M51379 Other intervertebral disc degeneration, lumbosacral region without mention of lumbar back pain or lower extremity pain: Secondary | ICD-10-CM | POA: Insufficient documentation

## 2020-08-12 DIAGNOSIS — M5417 Radiculopathy, lumbosacral region: Secondary | ICD-10-CM | POA: Insufficient documentation

## 2020-08-12 DIAGNOSIS — Z515 Encounter for palliative care: Secondary | ICD-10-CM

## 2020-08-12 DIAGNOSIS — M5137 Other intervertebral disc degeneration, lumbosacral region: Secondary | ICD-10-CM | POA: Insufficient documentation

## 2020-08-12 DIAGNOSIS — R339 Retention of urine, unspecified: Secondary | ICD-10-CM

## 2020-08-12 DIAGNOSIS — N400 Enlarged prostate without lower urinary tract symptoms: Secondary | ICD-10-CM | POA: Insufficient documentation

## 2020-08-12 NOTE — Progress Notes (Signed)
[facility] Consult Note Telephone: [phone]  Fax: [phone]     Date of encounter: 08/12/20 PATIENT NAME: Gregg Case. [address] [place] [address] [phone] (home)  DOB: 03/05/1933 MRN: [id number]  PRIMARY CARE PROVIDER:    Vidal Schwalbe, MD,  [address] [place] [address] [phone]  REFERRING PROVIDER:   Vidal Schwalbe, MD [address] [place],  [place] [address] [phone]  RESPONSIBLE PARTY:   Extended Emergency Contact Information Primary Emergency Contact: Geralynn Rile Home Phone: [phone] Relation: Daughter Secondary Emergency Contact: [place] Mobile Phone: [phone] Relation: Son  I met face to face with patient in his home. Palliative Care was asked to follow this patient by consultation request of Vidal Schwalbe, MD to help address advance care planning and goals of care. This is a follow up  visit.   ASSESSMENT AND RECOMMENDATIONS:   1. Advance Care Planning/Goals of Care: Goals include to maximize quality of life and symptom management. Our advance care planning conversation included a discussion about:      Exploration of personal, cultural or spiritual beliefs that might influence medical decisions    Identification of a healthcare agent - daughter  Review and updating or creation of an  advance directive document. Still has not discussed with POA. Continue to engage in conversation.   2. Symptom Management:  Urination: He has had several issues with his suprapubic catheter that he has struggled with. Most recently there was a unstoppable bleed that took him back to the ER and for surgery. Since that time he's been fine but he does worry about the catheter malfunctioning.   Pain: He has  pain especially in his back. He denies pain in the catheter area. However he formerly has used a back brace, abdominal binder style, for his radiculopathy. Since  getting the catheter he's afraid to use the binder. I would recommend that he consult with a brace shop or DME shop where they could fit him with a binder that would both clear his catheter and support his back.   I also asked him and his daughter to start him on acetaminophen CR 650 mg Q8 hours.  He has a bottle of tramadol from 5/21 with # 90. I recommend he continue to have prn narcotic due to his low volume use.  Immobility he endorses increasing in mobility due to pain not being treated. See above; hopefully the Tylenol and/or binder will improve that. He also has a lift chair but stated that they lost part of the cord and now he cannot use it. Upon further examination we found that it did take 2-9 V batteries. We were able to use these batteries and get the chair functional. I explained the missing electronic to his daughter and hopefully they can obtain the cord for the wall outlet.   Mood: patient is tearful today recounting his struggles and illnesses and God's goodness to him. Upon my departure his daughter told me that tomorrow is the first anniversary of his wife's passing. They were married 65 years and he grieves very much for her.   Care management: I checked into his urology follow up and he had an appointment  November 29 with radiology in [facility]. Family was not aware and he double booked with  PCP office. His daughter will straighten that out and get the hospital follow-up scheduled as well as the radiology follow up. There had been a problem with the case  management reaching him a month or so ago. I called the care management nurses and asked that they follow up with daughter Wanda instead as she manages patient's medical issues and patient may not answer the phone.   3. Follow up Palliative Care Visit: Palliative care will continue to follow for goals of care clarification and symptom management. Return 4-6 weeks or prn.  4. Family /Caregiver/Community Supports: Lives in home  with son. Sees local MD and drives 45 min to care in larger town.   5. Cognitive / Functional decline: A and O x 3. Mobility impacting ability for adls. States he is not going out much due to pain and decreasing stamina.  I spent 60 minutes providing this consultation,  from 1200 to 1300. More than 50% of the time in this consultation was spent coordinating communication.   CODE STATUS: FULL CODE  PPS: 50%  HOSPICE ELIGIBILITY/DIAGNOSIS: no  Subjective:  CHIEF COMPLAINT: pain, immobility  HISTORY OF PRESENT ILLNESS:  Gregg Coldren Jr. is a 86 y.o. year old male  with extensive OA, lumbar radiculopathy, suprapubic cath,  Immobility and pain with movement.  We are asked to consult around sx management and advance care planning.    History obtained from review of EMR,  and  interview with family, caregiver  and/or Gregg Case. Records reviewed and summarized above.   CURRENT PROBLEM LIST:  Patient Active Problem List   Diagnosis Date Noted  . BPH (benign prostatic hyperplasia) 08/12/2020  . Degeneration of lumbosacral intervertebral disc 08/12/2020  . Lumbosacral radiculitis 08/12/2020  . Bleeding 07/22/2020  . Pacemaker 09/26/2018  . S/P TURP 04/28/2018  . Complete heart block (HCC) 02/24/2018  . Dizziness 08/30/2017  . Bradycardia 08/27/2017  . Coronary artery disease 09/11/2016  . Essential hypertension 09/11/2016  . Hyperlipidemia, unspecified 09/11/2016  . GERD (gastroesophageal reflux disease) 09/11/2016  . Incomplete emptying of bladder 08/03/2016   PAST MEDICAL HISTORY:  Active Ambulatory Problems    Diagnosis Date Noted  . No Active Ambulatory Problems   Resolved Ambulatory Problems    Diagnosis Date Noted  . No Resolved Ambulatory Problems   Past Medical History:  Diagnosis Date  . Atrial fibrillation (HCC)   . Hypertension   . UTI (urinary tract infection)   . Vertigo    SOCIAL HX:  Social History   Tobacco Use  . Smoking status: Never Smoker    . Smokeless tobacco: Never Used  Substance Use Topics  . Alcohol use: Never   FAMILY HX: No family history on file.  ALLERGIES: No Known Allergies   PERTINENT MEDICATIONS:  Outpatient Encounter Medications as of 08/12/2020  Medication Sig  . amLODipine (NORVASC) 5 MG tablet Take 5 mg by mouth daily.  . apixaban (ELIQUIS) 5 MG TABS tablet Take 5 mg by mouth 2 (two) times daily.  . atorvastatin (LIPITOR) 40 MG tablet Take 40 mg by mouth daily.  . cephALEXin (KEFLEX) 500 MG capsule Take 1 capsule (500 mg total) by mouth 4 (four) times daily.  . cetirizine (ZYRTEC) 10 MG tablet Take 10 mg by mouth daily.  . finasteride (PROSCAR) 5 MG tablet Take 5 mg by mouth daily.  . lisinopril (ZESTRIL) 20 MG tablet Take 20 mg by mouth 2 (two) times daily.  . pantoprazole (PROTONIX) 40 MG tablet Take 40 mg by mouth 2 (two) times daily.  . potassium chloride SA (KLOR-CON) 20 MEQ tablet Take 1 tablet (20 mEq total) by mouth 2 (two) times daily.  . sucralfate (CARAFATE) 1 g   tablet Take 1 g by mouth 2 (two) times daily.  . tamsulosin (FLOMAX) 0.4 MG CAPS capsule Take 0.8 mg by mouth.  . traMADol (ULTRAM) 50 MG tablet Take 1 tablet by mouth 3 (three) times daily as needed.   No facility-administered encounter medications on file as of 08/12/2020.    Objective: ROS  General: NAD ENMT: denies dysphagia Cardiovascular: denies chest pain Pulmonary: denies  cough, denies increased SOB  Abdomen: endorses good appetite, denies  constipation, endorses continence of bowel GU: denies dysuria, suprapubic catheter MSK:  Endorses increasing  ROM limitations, no falls reported Skin: denies rashes or wounds outside cath insertion Neurological: endorses weakness, endorses moderate  back and shoulder pain,endorses occ  insomnia Psych: Endorses depressed  mood Heme/lymph/immuno: denies bruises, abnormal bleeding  Physical Exam: Current and past weights: 205 lb, BMI 27.1 reported Constitutional: VNAD General  :frail appearing, WNWD EYES: anicteric sclera,lids intact, no discharge  ENMT: intact hearing,oral mucous membranes moist CV: slight LE edema Pulmonary: no increased work of breathing, no cough,  room air Abdomen: intake 75-100%,  no ascites GU: deferred MSK: moderate sacropenia, decreased ROM in all extremities, no contractures of LE,  Ambulatory with cane with difficulty Skin: warm and dry, no rashes or wounds on visible skin Neuro:  Increased general weakness, no cognitive impairment Psych: slightly anxious affect, A and O x 3 Hem/lymph/immuno/ no widespread bruising, on apixiban currently   Thank you for the opportunity to participate in the care of Gregg Case.  The palliative care team will continue to follow. Please call our office at [phone] if we can be of additional assistance.  Jason Coop, NP , DNP, MPH, AGPCNP-BC, ACHPN  COVID-19 PATIENT SCREENING TOOL  Person answering questions: ____________SELF______ _____   1.  Is the patient or any family member in the home showing any signs or symptoms regarding respiratory infection?               Person with Symptom- __________NA_________________  a. Fever                                                                          Yes___ No___          ___________________  b. Shortness of breath                                                    Yes___ No___          ___________________ c. Cough/congestion                                       Yes___  No___         ___________________ d. Body aches/pains  Yes___ No___        ____________________ e. Gastrointestinal symptoms (diarrhea, nausea)           Yes___ No___        ____________________  2. Within the past 14 days, has anyone living in the home had any contact with someone with or under investigation for COVID-19?    Yes___ No_X_   Person __________________

## 2020-08-13 NOTE — Telephone Encounter (Signed)
error 

## 2020-11-15 ENCOUNTER — Other Ambulatory Visit: Payer: Medicare PPO | Admitting: Primary Care

## 2020-11-15 ENCOUNTER — Other Ambulatory Visit: Payer: Self-pay

## 2020-11-15 ENCOUNTER — Telehealth: Payer: Self-pay | Admitting: Primary Care

## 2020-11-15 NOTE — Telephone Encounter (Signed)
Home visit made per confirmed call with Carolyne Littles on Friday. Patient was not at home.

## 2020-11-25 ENCOUNTER — Other Ambulatory Visit: Payer: Medicare PPO | Admitting: Primary Care

## 2020-11-25 ENCOUNTER — Other Ambulatory Visit: Payer: Self-pay

## 2020-11-25 DIAGNOSIS — M5416 Radiculopathy, lumbar region: Secondary | ICD-10-CM

## 2020-11-25 DIAGNOSIS — Z9359 Other cystostomy status: Secondary | ICD-10-CM

## 2020-11-25 DIAGNOSIS — M158 Other polyosteoarthritis: Secondary | ICD-10-CM

## 2020-11-25 NOTE — Progress Notes (Signed)
[facility] Consult Note Telephone: [phone]  Fax: [phone]    Date of encounter: 11/25/20 PATIENT NAME: Gregg Case. [address] [place] [address] [phone] (home)  DOB: Oct 25, 1932 MRN: [id number]  PRIMARY CARE PROVIDER:    Vidal Schwalbe, MD,  [address] [place] [address] [phone]  REFERRING PROVIDER:   Vidal Schwalbe, MD [address] [place],  [place] [address] [phone]  RESPONSIBLE PARTY:   Extended Emergency Contact Information Primary Emergency Contact: Geralynn Rile Home Phone: [phone] Relation: Daughter Secondary Emergency Contact: [place] Mobile Phone: [phone] Relation: Son  I met face to face with patient and family in home. Palliative Care was asked to follow this patient by consultation request of Vidal Schwalbe, MD to help address advance care planning and goals of care. This is a follow up  visit.   ASSESSMENT AND RECOMMENDATIONS:   1. Advance Care Planning/Goals of Care: Goals include to maximize quality of life and symptom management.   2. Symptom Management:   Urination: He has had several issues with his suprapubic catheter that has been working well lately. He recently had it changed, and it has been working well. It has good output.   Pain: He has pain especially in his back that is chronic. He has icy hot ointment  sitting next to him that he has been using. He is also using horse liniment that he states is working well.   I also asked him and his daughter to start him on acetaminophen CR 650 mg Q8 hours, but it does not seem that he has started that. He does have it at his house. He will try this for pain.   It could be added to his pre packaged meds if desired, 1300 mg bid is my recommendation.  Immobility His mobility does seem to be improving. He is getting around his house with his cane without issues. No falls. He was able to  make his own breakfast this morning.   Mood: Patients wife passed approx 16 months ago. His mood seems much better this visit. He jokes with [place] today and is fairly talkative. States his daughters check on him daily and his son is now away, driving a truck.   3. Follow up Palliative Care Visit: Palliative care will continue to follow for goals of care clarification and symptom management. Return 6-8 weeks or prn.  4. Family /Caregiver/Community Supports: He has been living with his son. His son is now driving trucks, so he is gone for extended periods of time. His daughter does live in town and is a support for him. His granddaughter is helps with care giving.  5. Cognitive / Functional decline: Cognitively intact, functional status seems to be improved from prior visit. He is walking today around the house during our visit. He is fairly stable with a cane . Needs some assistance with iadls.  I spent 40 minutes providing this consultation,  from 1220 to 1300. More than 50% of the time in this consultation was spent in counseling and care coordination.  CODE STATUS: FULL   PPS: 50%  HOSPICE ELIGIBILITY/DIAGNOSIS: TBD  Subjective:   CHIEF COMPLAINT: follow up for pain, decreased mobility   HISTORY OF PRESENT ILLNESS:  Gregg Case. is a 87 y.o. year old male  with with extensive OA, lumbar radiculopathy, suprapubic cath,  Immobility and pain with movement. He is seen today in his home by himself.   We are  asked to consult around advance care planning and complex medical decision making.    Review and summarization of old Epic records shows or history from other than patient.  Review or lab tests, radiology,  or medicine. Epic notes from [facility]  From urology.  History obtained from review of EMR, discussion with primary team, and  interview with family, caregiver  and/or Mr. Bartolo. Records reviewed and summarized above.   CURRENT PROBLEM LIST:  Patient Active Problem List    Diagnosis Date Noted  . BPH (benign prostatic hyperplasia) 08/12/2020  . Degeneration of lumbosacral intervertebral disc 08/12/2020  . Lumbosacral radiculitis 08/12/2020  . Bleeding 07/22/2020  . Pacemaker 09/26/2018  . S/P TURP 04/28/2018  . Complete heart block ([facility]) 02/24/2018  . Dizziness 08/30/2017  . Bradycardia 08/27/2017  . Coronary artery disease 09/11/2016  . Essential hypertension 09/11/2016  . Hyperlipidemia, unspecified 09/11/2016  . GERD (gastroesophageal reflux disease) 09/11/2016  . Incomplete emptying of bladder 08/03/2016   PAST MEDICAL HISTORY:  Active Ambulatory Problems    Diagnosis Date Noted  . Bleeding 07/22/2020  . BPH (benign prostatic hyperplasia) 08/12/2020  . Bradycardia 08/27/2017  . Complete heart block ([place] [facility]) 02/24/2018  . Coronary artery disease 09/11/2016  . Degeneration of lumbosacral intervertebral disc 08/12/2020  . Dizziness 08/30/2017  . Essential hypertension 09/11/2016  . Hyperlipidemia, unspecified 09/11/2016  . GERD (gastroesophageal reflux disease) 09/11/2016  . Lumbosacral radiculitis 08/12/2020  . Incomplete emptying of bladder 08/03/2016  . Pacemaker 09/26/2018  . S/P TURP 04/28/2018   Resolved Ambulatory Problems    Diagnosis Date Noted  . No Resolved Ambulatory Problems   Past Medical History:  Diagnosis Date  . Atrial fibrillation ([facility])   . Hypertension   . UTI (urinary tract infection)   . Vertigo    SOCIAL HX:  Social History   Tobacco Use  . Smoking status: Never Smoker  . Smokeless tobacco: Never Used  Substance Use Topics  . Alcohol use: Never   FAMILY HX: No family history on file.    ALLERGIES: No Known Allergies   PERTINENT MEDICATIONS:  Outpatient Encounter Medications as of 11/25/2020  Medication Sig  . amLODipine (NORVASC) 5 MG tablet Take 5 mg by mouth daily.  Marland Kitchen apixaban (ELIQUIS) 5 MG TABS tablet Take 5 mg by mouth 2 (two) times daily.  Marland Kitchen atorvastatin (LIPITOR) 40 MG tablet Take 40 mg by mouth  daily.  . cephALEXin (KEFLEX) 500 MG capsule Take 1 capsule (500 mg total) by mouth 4 (four) times daily.  . cetirizine (ZYRTEC) 10 MG tablet Take 10 mg by mouth daily.  . finasteride (PROSCAR) 5 MG tablet Take 5 mg by mouth daily.  Marland Kitchen lisinopril (ZESTRIL) 20 MG tablet Take 20 mg by mouth 2 (two) times daily.  . pantoprazole (PROTONIX) 40 MG tablet Take 40 mg by mouth 2 (two) times daily.  . potassium chloride SA (KLOR-CON) 20 MEQ tablet Take 1 tablet (20 mEq total) by mouth 2 (two) times daily.  . sucralfate (CARAFATE) 1 g tablet Take 1 g by mouth 2 (two) times daily.  . tamsulosin (FLOMAX) 0.4 MG CAPS capsule Take 0.8 mg by mouth.  . traMADol (ULTRAM) 50 MG tablet Take 1 tablet by mouth 3 (three) times daily as needed.   No facility-administered encounter medications on file as of 11/25/2020.    Objective: ROS  General: NAD ENMT: denies dysphagia Cardiovascular: denies chest pain Pulmonary: denies  cough, denies increased SOB Abdomen: endorses good appetite, endorses intermittent constipation, endorses continence of bowel GU:  denies dysuria, supra pubic catheter working well MSK:  endorses ROM limitations, no falls reported Skin: denies rashes or wounds Neurological: endorses weakness, denies pain, denies insomnia Psych: Endorses positive mood Heme/lymph/immuno: denies bruises, abnormal bleeding  Physical Exam: Current and past weights: not available Constitutional: NAD General: frail appearing, /WNWD EYES: anicteric sclera, lids intact, no discharge  ENMT: intact hearing,oral mucous membranes moist, dentition intact CV: S1S2, RRR, no LE edema Pulmonary: LCTA, no increased work of breathing, no cough, no audible wheezes, room air Abdomen: intake 50-75%, soft and non tender, no ascites GU: deferred MSK: mod sarcopenia, decreased ROM in all extremities, no contractures of LE, ambulatory  With cane Skin: warm and dry, no rashes or wounds on visible skin Neuro: Generalized  weakness, no cognitive impairment Psych: non-anxious affect, A and O x 4 Hem/lymph/immuno: no widespread bruising   Thank you for the opportunity to participate in the care of Mr. Wickham.  The palliative care team will continue to follow. Please call our office at [phone] if we can be of additional assistance.  Jason Coop, NP , DNP, MPH, AGPCNP-BC, ACHPN   COVID-19 PATIENT SCREENING TOOL  Person answering questions: _______pt____________   1.  Is the patient or any family member in the home showing any signs or symptoms regarding respiratory infection?                  Person with Symptom  ______________na___________ a. Fever/chills/headache                                                        Yes___ No__X_            b. Shortness of breath                                                            Yes___ No__X_           c. Cough/congestion                                               Yes___  No__X_          d. Muscle/Body aches/pains                                                   Yes___ No__X_         e. Gastrointestinal symptoms (diarrhea,nausea)             Yes___ No__X_         f. Sudden loss of smell or taste      Yes___ No__X_        2. Within the past 10 days, has anyone living in the home had any contact with someone with or under investigation for COVID-19?    Yes___ No__X__   Person __________________

## 2021-03-07 ENCOUNTER — Other Ambulatory Visit: Payer: Medicare PPO | Admitting: Primary Care

## 2021-03-07 ENCOUNTER — Other Ambulatory Visit: Payer: Self-pay

## 2021-03-07 DIAGNOSIS — Z9359 Other cystostomy status: Secondary | ICD-10-CM

## 2021-03-07 DIAGNOSIS — Z515 Encounter for palliative care: Secondary | ICD-10-CM

## 2021-03-07 DIAGNOSIS — M158 Other polyosteoarthritis: Secondary | ICD-10-CM

## 2021-03-07 NOTE — Progress Notes (Addendum)
[facility] Consult Note Telephone: [phone]  Fax: [phone]    Date of encounter: 03/07/21 PATIENT NAME: Gregg Case. [address] [place] [address]   [phone] (home)  DOB: 07/19/1933 MRN: [id number] PRIMARY CARE PROVIDER:    Vidal Schwalbe, MD,  [address] [place] [address] [phone]  REFERRING PROVIDER:   Vidal Schwalbe, MD [address] [place],  [place] [address] [phone]  RESPONSIBLE PARTY:    Contact Information     Name Relation Home Work Gregg Case, Gregg Case Daughter [phone]     Gregg Case, Gregg Case   [phone]   Gregg Case, Gregg Case Daughter   [phone]        I met face to face with patient and family in  home. Palliative Care was asked to follow this patient by consultation request of  Gregg Schwalbe, MD to address advance care planning and complex medical decision making. This is a follow up visit.                                   ASSESSMENT AND PLAN / RECOMMENDATIONS:   Advance Care Planning/Goals of Care: Goals include to maximize quality of life and symptom management. Our advance care planning conversation included a discussion about:    Exploration of personal, cultural or spiritual beliefs that might influence medical decisions CODE STATUS: FULL  Addendum to address inadvertent charting.  Symptom Management/Plan:  Catheter: I met with patient in his home he had recently gone to the hospital for replacement of his super pubic catheter. He states that generally he's not having trouble with this. I noticed that he is still on finasteride and asked him to ask his urologist if this medication was still needed. He does voice a desire to de prescribed if possible.   Mobility he endorses difficulty walking and demonstrates some lack of balance and ataxia. He also endorses a right rotator cuff issue that is very debilitating and painful. We  discussed safety with mobility especially in the face of joint impairment. I encouraged him to try a standard walker. He was interested in a rollator. I would recommend physical therapy assessment for the proper ambulatory device.    Constipation: Endorses significant constipation; , he states that he takes Metamucil fiber capsules, two a day and drinks 32 ounces of liquid for each of these capsules. I encouraged him to add MiraLAX to the liquid and also initiate 2 senna once or twice a day until he achieve the proper level of bowel movement.      Follow up Palliative Care Visit: Palliative care will continue to follow for complex medical decision making, advance care planning, and clarification of goals. Return 8 weeks or prn.  I spent 60 minutes providing this consultation. More than 50% of the time in this consultation was spent in counseling and care coordination.  PPS: 40%  HOSPICE ELIGIBILITY/DIAGNOSIS: TBD  Chief Complaint: constipation  HISTORY OF PRESENT ILLNESS:  Gregg Case. is a 87 y.o. year old male  with suprapubic catheter, OA, rotator pain, constipation .   History obtained from review of EMR, discussion with primary team, and interview with family, facility staff/caregiver and/or Gregg Case.  I reviewed available labs, medications, imaging, studies and related documents from the EMR.  Records reviewed and summarized above.   ROS  General: NAD EYES: denies vision changes- no exam in  a while ENMT: denies dysphagia Cardiovascular: denies chest pain, denies DOE Pulmonary: denies cough, denies increased SOB Abdomen: endorses good appetite, endorses constipation, endorses continence of bowel GU: denies dysuria, endorses incontinence of urine-suprapubic cath MSK:  endorses weakness,  no falls reported Skin: denies rashes or wounds Neurological: endorses  pain, denies insomnia Psych: Endorses positive mood Heme/lymph/immuno: denies bruises, abnormal  bleeding  Physical Exam: Current and past weights: 180 lbs last entered Constitutional: NAD General: frail appearing, WNWD EYES: anicteric sclera, lids intact, no discharge  ENMT: intact hearing, oral mucous membranes moist CV: S1S2, RRR, 2+ bil LE edema Pulmonary: LCTA, no increased work of breathing, no cough, room air Abdomen: intake 100%, no ascites MSK: mild  sarcopenia, moves all extremities, ambulatory Skin: warm and dry, no rashes or wounds on visible skin Neuro:  + generalized weakness,  no cognitive impairment Psych: non-anxious affect, A and O x 3 Hem/lymph/immuno: no widespread bruising   Thank you for the opportunity to participate in the care of Gregg Case.  The palliative care team will continue to follow. Please call our office at [phone] if we can be of additional assistance.   Gregg Coop, NP , DNP, MPH, AGPCNP-BC, ACHPN  COVID-19 PATIENT SCREENING TOOL Asked and negative response unless otherwise noted:   Have you had symptoms of covid, tested positive or been in contact with someone with symptoms/positive test in the past 5-10 days?

## 2021-03-21 ENCOUNTER — Other Ambulatory Visit: Payer: Medicare PPO | Admitting: Primary Care

## 2021-06-24 ENCOUNTER — Telehealth: Payer: Self-pay | Admitting: Primary Care

## 2021-06-24 NOTE — Telephone Encounter (Signed)
At the request of PalliativeNP I called daughter to confirm Palliative f/u appointment for patient on 06/27/21 @ 12 Noon, left message requesting a return call to confirm appointment

## 2021-06-27 ENCOUNTER — Other Ambulatory Visit: Payer: Self-pay

## 2021-06-27 ENCOUNTER — Other Ambulatory Visit: Payer: Medicare PPO | Admitting: Primary Care

## 2021-06-27 DIAGNOSIS — Z9359 Other cystostomy status: Secondary | ICD-10-CM

## 2021-06-27 DIAGNOSIS — M158 Other polyosteoarthritis: Secondary | ICD-10-CM

## 2021-06-27 DIAGNOSIS — Z515 Encounter for palliative care: Secondary | ICD-10-CM

## 2021-06-27 DIAGNOSIS — M5416 Radiculopathy, lumbar region: Secondary | ICD-10-CM

## 2021-06-27 NOTE — Progress Notes (Signed)
Therapist, nutritional Palliative Care Consult Note Telephone: [phone]  Fax: [phone]    Date of encounter: 06/27/21 12:49 PM PATIENT NAME: Gregg Case. [address] [place] [address]   [phone] (home)  DOB: [birth year] MRN: [id number] PRIMARY CARE PROVIDER:    [name], MD,  [address] [place] [address] [phone]  REFERRING PROVIDER:   [name], MD [address] [place],  [place] [address] [phone]  RESPONSIBLE PARTY:    Contact Information     Name Relation Home Work Gregg Case [phone]     Gregg Case   [phone]   Gregg Case Case   [phone]        I met face to face with patient in home. Palliative Care was asked to follow this patient by consultation request of  Gregg Fedorko Robert, MD to address advance care planning and complex medical decision making. This is a follow up visit.                                   ASSESSMENT AND PLAN / RECOMMENDATIONS:   Advance Care Planning/Goals of Care: Goals include to maximize quality of life and symptom management. Our advance care planning conversation included a discussion about:    The value and importance of advance care planning  Experiences with loved ones who have been seriously ill or have died  Exploration of personal, cultural or spiritual beliefs that might influence medical decisions - does not think having surgery would help now.  Exploration of goals of care in the event of a sudden injury or illness  Identification of a healthcare agent - Daughters Gregg Case and Gregg Case Review of an  advance directive document- Gregg Case still has MOST from previous visit. Discussed with Gregg Case, patient has been ' back and forth" and eventually said he didn't want to discuss ACP. I encouraged her to use MOST which gives a range of ACP decisions, not just DNR. She voices understanding and will try to  discuss with her dad again. During our ACP this visit pt states he knew he has to die eventually but 'has a few more stops along the way". CODE STATUS: FULL as of now ( no acp)  Symptom Management/Plan:  Constipation: Improved but not resolved. Has miralax but I recommend occasional  senna as needed.  Pain: Has multiple joints which are arthritic and does not elect to have any surgery. Has knee and ankle braces for comfort.  Edema: Cardiology noted lasix 20 mg but he does not have it at pharmacy med profile. Call to pharmacy to reconcile meds all meds pt currently taking. 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 Call to [facility] cardiology [place], message left for Gregg Case to address 20 mg lasix and that he does not take potassium now, either, per pharmacist.   Follow up Palliative Care Visit: Palliative care will continue to follow for complex medical decision making, advance care planning, and clarification of goals. Return 8-12 weeks or as needed. I spent 60 minutes providing this consultation. More than 50% of the time in this consultation was spent in counseling and care coordination.  PPS: 40%  HOSPICE ELIGIBILITY/DIAGNOSIS: no  Chief Complaint: mobility, chronic pain  HISTORY OF PRESENT ILLNESS:  Gregg Chenault. is a 87 y.o. year old male  with OA with multiple arthritic  sites, 3+ LE edema, immobility.   History obtained from review of EMR, discussion with primary team, and interview with family, facility staff/caregiver and/or Gregg Case.  I reviewed available labs, medications, imaging, studies and related documents from the EMR.  Records reviewed and summarized above.  Reviewed notes from cardiology appt 06/16/21.  ROS   General: NAD EYES: denies vision changes ENMT: denies dysphagia Cardiovascular: denies chest pain,  endorses DOE Pulmonary: denies cough, denies increased SOB Abdomen: endorses good appetite, endorses constipation, endorses  continence of bowel GU: denies dysuria, endorses continence of urine MSK:  denies weakness,  no falls reported Skin: denies rashes or wounds Neurological: endorses  OA  pain, denies insomnia Psych: Endorses positive mood Heme/lymph/immuno: denies bruises, abnormal bleeding  Physical Exam: Current and past weights: unavailable  Constitutional: NAD General: frail appearing,  WNWD, HR 68 RR 18, 164/93 (has not had meds yet today) EYES: anicteric sclera, lids intact, no discharge  ENMT: intact hearing, oral mucous membranes moist, dentition intact CV: S1S2, RRR, 3+ LE edema Pulmonary: LCTA, no increased work of breathing, no cough, room air, 93% Abdomen: intake 100%, normo-active BS + 4 quadrants, soft and non tender, no ascites GU: deferred MSK: mild sarcopenia, moves all extremities, ambulatory with cane Skin: warm and dry, no rashes or wounds on visible skin Neuro:  ++ generalized weakness,  no cognitive impairment Psych: non-anxious affect, A and O x 3 Hem/lymph/immuno: no widespread bruising  Outpatient Encounter Medications as of 06/27/2021  Medication Sig   amLODipine (NORVASC) 5 MG tablet Take 5 mg by mouth daily.   apixaban (ELIQUIS) 5 MG TABS tablet Take 5 mg by mouth 2 (two) times daily.   atorvastatin (LIPITOR) 40 MG tablet Take 40 mg by mouth daily.   cephALEXin (KEFLEX) 500 MG capsule Take 1 capsule (500 mg total) by mouth 4 (four) times daily. (Patient taking differently: Take 250 mg by mouth daily at 12 noon.)   cetirizine (ZYRTEC) 10 MG tablet Take 10 mg by mouth daily.   finasteride (PROSCAR) 5 MG tablet Take 5 mg by mouth daily.   lisinopril (ZESTRIL) 20 MG tablet Take 20 mg by mouth 2 (two) times daily.   meloxicam (MOBIC) 15 MG tablet Take 1 tablet by mouth daily.   pantoprazole (PROTONIX) 40 MG tablet Take 40 mg by mouth 2 (two) times daily.   tamsulosin (FLOMAX) 0.4 MG CAPS capsule Take 0.8 mg by mouth.   potassium chloride SA (KLOR-CON) 20 MEQ tablet Take 1  tablet (20 mEq total) by mouth 2 (two) times daily. (Patient not taking: Reported on 06/27/2021)   sucralfate (CARAFATE) 1 g tablet Take 1 g by mouth 2 (two) times daily. (Patient not taking: Reported on 06/27/2021)   [DISCONTINUED] traMADol (ULTRAM) 50 MG tablet Take 1 tablet by mouth 3 (three) times daily as needed.   No facility-administered encounter medications on file as of 06/27/2021.    Thank you for the opportunity to participate in the care of Gregg Case.  The palliative care team will continue to follow. Please call our office at [phone] if we can be of additional assistance.   Jason Coop, NP   COVID-19 PATIENT SCREENING TOOL Asked and negative response unless otherwise noted:   Have you had symptoms of covid, tested positive or been in contact with someone with symptoms/positive test in the past 5-10 days?

## 2021-09-05 ENCOUNTER — Other Ambulatory Visit: Payer: Medicare PPO | Admitting: Primary Care

## 2021-10-03 ENCOUNTER — Other Ambulatory Visit: Payer: Medicare PPO | Admitting: Primary Care

## 2021-10-28 ENCOUNTER — Telehealth: Payer: Self-pay

## 2021-10-28 NOTE — Telephone Encounter (Signed)
1040 am.  Phone call made to daughter Gregg Case to schedule a home visit with [name], NP.  No answer but message has been left requesting a call back.

## 2021-10-28 NOTE — Telephone Encounter (Signed)
325 pm.  Incoming call from [facility] regarding appointment.  Patient has a scheduled appointment in [place] on 2/8 @ 130 and would not be able to meet with the NP that day.  Visit scheduled for 2/22 @ 11 am.

## 2021-11-16 ENCOUNTER — Other Ambulatory Visit: Payer: Medicare PPO | Admitting: Primary Care

## 2021-11-30 ENCOUNTER — Other Ambulatory Visit: Payer: Medicare PPO | Admitting: Primary Care

## 2021-11-30 ENCOUNTER — Other Ambulatory Visit: Payer: Self-pay

## 2021-11-30 DIAGNOSIS — Z515 Encounter for palliative care: Secondary | ICD-10-CM

## 2021-11-30 DIAGNOSIS — Z9359 Other cystostomy status: Secondary | ICD-10-CM

## 2021-11-30 DIAGNOSIS — M5137 Other intervertebral disc degeneration, lumbosacral region: Secondary | ICD-10-CM

## 2021-11-30 DIAGNOSIS — M51379 Other intervertebral disc degeneration, lumbosacral region without mention of lumbar back pain or lower extremity pain: Secondary | ICD-10-CM

## 2021-11-30 NOTE — Progress Notes (Signed)
? ? ?Manufacturing engineer ?Community Palliative Care Consult Note ?Telephone: [phone]  ?Fax: [phone]  ? ? ?Date of encounter: 11/30/21 ?3:27 PM ?PATIENT NAME: Gregg Case. ?[address] ?[place] [address]   ?[phone] (home)  ?DOB: 1933/09/03 ?MRN: [id number] ?PRIMARY CARE PROVIDER:    ?Gregg Schwalbe, MD,  ?[address] ?[place] [address] ?[phone] ? ?REFERRING PROVIDER:   ?Gregg Schwalbe, MD ?[address] ?[place],  [place] [address] ?[phone] ? ?RESPONSIBLE PARTY:    ?Contact Information   ? ? Name Relation Home Work Mobile  ? Gregg Case Daughter [phone]    ? Gregg Case Son   [phone]  ? Gregg Case Daughter   [phone]  ? ?  ? ? ? ?I met face to face with patient in his home. Palliative Care was asked to follow this patient by consultation request of  Gregg Schwalbe, MD to address advance care planning and complex medical decision making. This is a follow up visit. ? ?                                 ASSESSMENT AND PLAN / RECOMMENDATIONS:  ? ?Advance Care Planning/Goals of Care: Goals include to maximize quality of life and symptom management. Patient/health care surrogate gave his/her permission to discuss.Our advance care planning conversation included a discussion about:    ?The value and importance of advance care planning  ?Exploration of personal, cultural or spiritual beliefs that might influence medical decisions  ?Exploration of goals of care in the event of a sudden injury or illness  ?NO ACP documents made ? ?Symptom Management/Plan: ? ?Patient visited today, states he has been well. Has had cath changes in the office and  no hospital stay since my last visit 4 months ago. Family takes him to appointments but he also drives in town to store, Armed forces logistics/support/administrative officer.  ? ?He states he is getting out and seeing people some, and has some contacts. Wife died 37 mos ago. Son is truckdriver who checks on him and his daughters also  help when needed.Marland Kitchen He passes time with TV and friend visits. Denies falls, uses cane for managing around the house and a walker for going out.  ? ?Follow up Palliative Care Visit: Palliative care will continue to follow for complex medical decision making, advance care planning, and clarification of goals. Return 12 weeks or prn. ? ?I spent  30  minutes providing this consultation. More than 50% of the time in this consultation was spent in counseling and care coordination. ? ?PPS: 40% ? ?HOSPICE ELIGIBILITY/DIAGNOSIS: TBD ? ?Chief Complaint: immobility ? ?HISTORY OF PRESENT ILLNESS:  Gregg Case. is a 88 y.o. year old male  with suprapubic cath, OA, debility, immobility. Seen for PC follow up .  ? ?History obtained from review of EMR, discussion with primary team, and interview with family, facility staff/caregiver and/or Gregg Case.  ?I reviewed available labs, medications, imaging, studies and related documents from the EMR.  Records reviewed and summarized above.  ? ?ROS ? ? ?General: NAD ?EYES: denies vision changes ?ENMT: denies dysphagia ?Cardiovascular: denies chest pain, denies DOE ?Pulmonary: denies cough, denies increased SOB ?Abdomen: endorses good appetite, denies constipation, endorses continence of bowel ?GU: denies dysuria, suprapubic catheter ?MSK:  denies  increased weakness,  no falls reported ?Skin: denies rashes or wounds ?Neurological: denies pain, denies insomnia ?Psych: Endorses positive mood ?Heme/lymph/immuno: denies bruises, abnormal bleeding ? ?  Physical Exam: ?Current and past weights: stable  ?Constitutional: NAD ?General: frail appearing, wnwd ?EYES: anicteric sclera, lids intact, no discharge  ?ENMT: intact hearing, oral mucous membranes moist ?CV: 2+ LE edema ?Pulmonary:  no increased work of breathing, no cough, room air ?Abdomen: intake 100%, no ascites ?GU: deferred ?MSK: no sarcopenia, moves all extremities, ambulatory with cane ?Skin: warm and dry, no rashes or wounds  on visible skin ?Neuro:  no generalized weakness,  no cognitive impairment ?Psych: non-anxious affect, A and O x 3 ?Hem/lymph/immuno: no widespread bruising ? ? ?Thank you for the opportunity to participate in the care of Gregg Case.  The palliative care team will continue to follow. Please call our office at [phone] if we can be of additional assistance.  ? ?Gregg Coop, NP DNP, AGPCNP-BC ? ?COVID-19 PATIENT SCREENING TOOL ?Asked and negative response unless otherwise noted:  ? ?Have you had symptoms of covid, tested positive or been in contact with someone with symptoms/positive test in the past 5-10 days?  ? ?

## 2021-12-31 IMAGING — DX DG ABDOMEN ACUTE W/ 1V CHEST
3 series · 3 of 3 positions shown · non-contrast
Comparison: None.

CLINICAL DATA: Chills, nausea and vomiting.

EXAM:
DG ABDOMEN ACUTE W/ 1V CHEST

[chest pa]
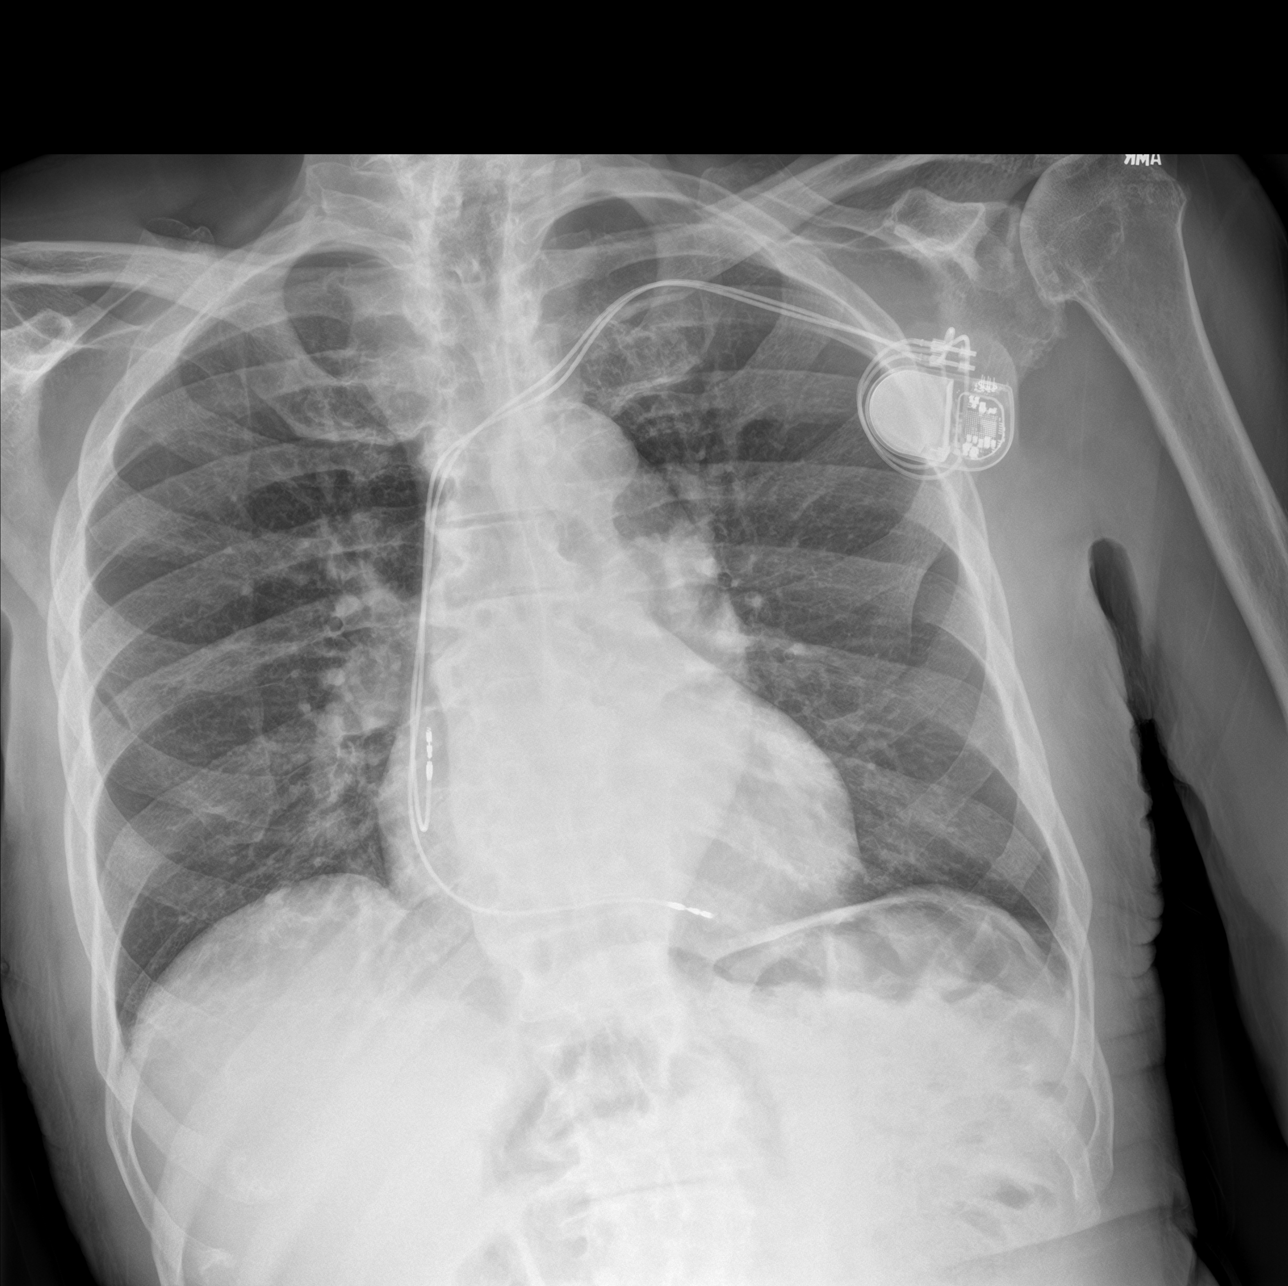

[abdomen erect]
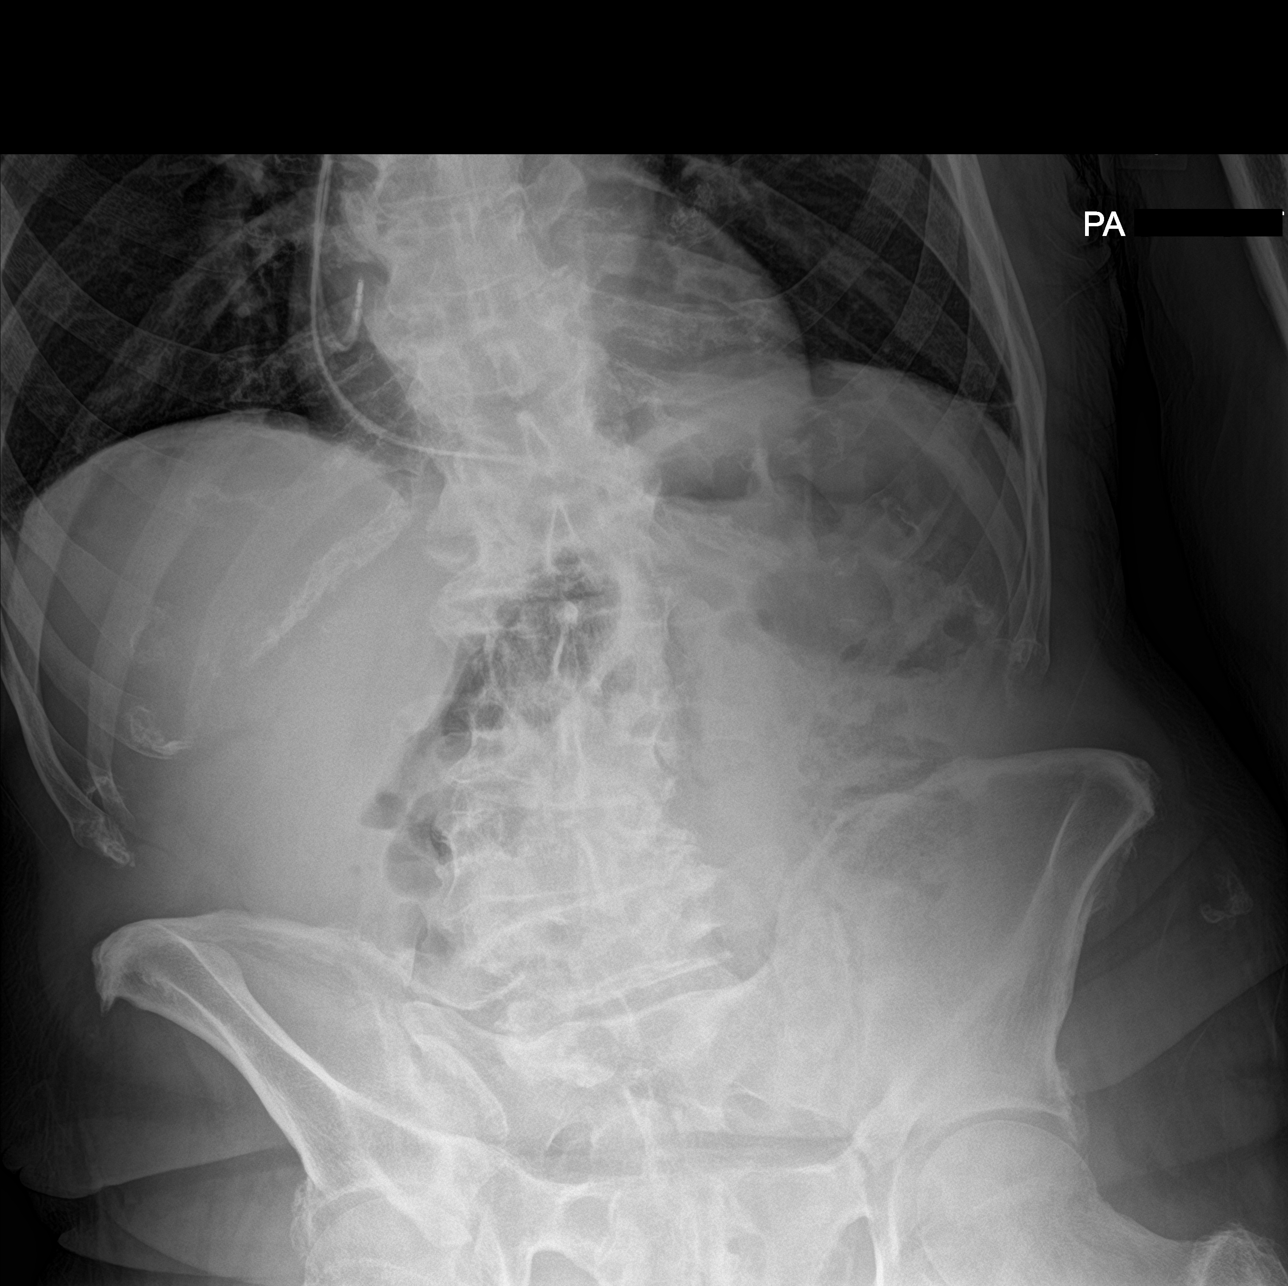

[abdomen supine]
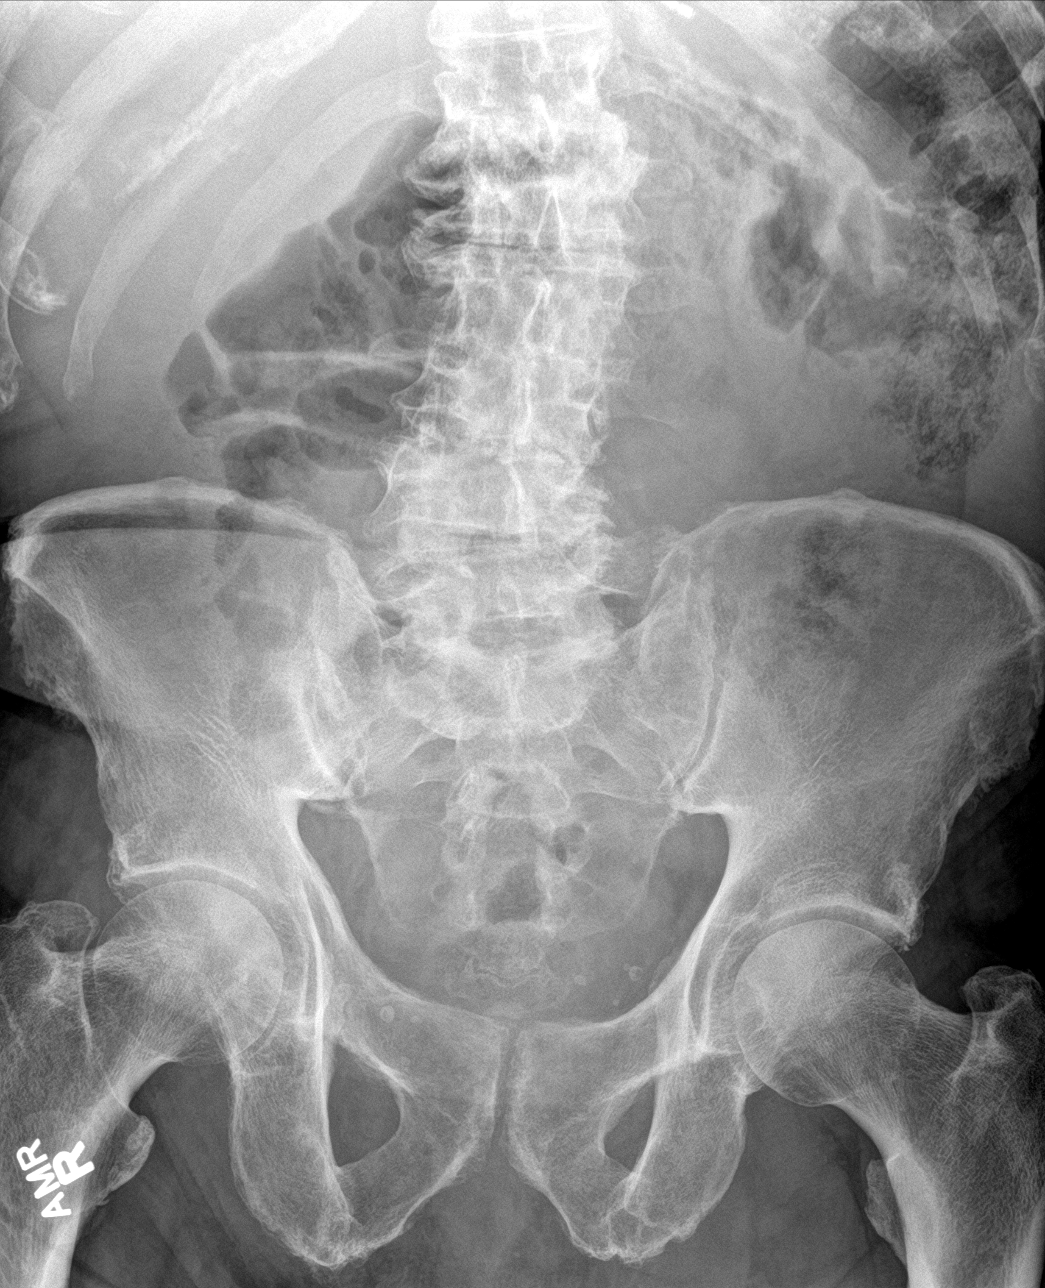

[3 of 3 positions shown; findings below may reference images not displayed]

FINDINGS: There is a dual lead AICD. There is no evidence of acute infiltrate,
pleural effusion or pneumothorax. There is no evidence of dilated
bowel loops or free intraperitoneal air. No radiopaque calculi or
other significant radiographic abnormality is seen. There is marked
severity tortuosity of the descending thoracic aorta. Heart size and
mediastinal contours are within normal limits. Marked severity
degenerative changes seen throughout the thoracolumbar spine.
IMPRESSION: 1. No acute or active cardiopulmonary disease.
2. No evidence of bowel obstruction.

## 2022-03-30 ENCOUNTER — Telehealth: Payer: Self-pay

## 2022-03-30 NOTE — Telephone Encounter (Signed)
1230 pm.  Phone call made to [facility] of patient to schedule a home visit for 8/9 @ 11 am.  No answer.  Message left requesting a call back.

## 2022-06-12 ENCOUNTER — Telehealth: Payer: Self-pay

## 2022-06-12 NOTE — Telephone Encounter (Signed)
930 am.  Follow up call made to daughter Santiago Glad regarding home visit with [name], NP.  Daughter agreeable with Wednesday at 2 pm.

## 2022-06-14 ENCOUNTER — Other Ambulatory Visit: Payer: Medicare PPO | Admitting: Primary Care

## 2022-06-14 DIAGNOSIS — M51379 Other intervertebral disc degeneration, lumbosacral region without mention of lumbar back pain or lower extremity pain: Secondary | ICD-10-CM

## 2022-06-14 DIAGNOSIS — M158 Other polyosteoarthritis: Secondary | ICD-10-CM

## 2022-06-14 DIAGNOSIS — M5137 Other intervertebral disc degeneration, lumbosacral region: Secondary | ICD-10-CM

## 2022-06-14 DIAGNOSIS — Z9359 Other cystostomy status: Secondary | ICD-10-CM

## 2022-06-14 DIAGNOSIS — Z515 Encounter for palliative care: Secondary | ICD-10-CM

## 2022-06-14 NOTE — Progress Notes (Signed)
Gregg Case: [phone]  Fax: [phone]    Date of encounter: 06/14/22 2:14 PM PATIENT NAME: Gregg Case. [address] [address]   [phone] (home)  DOB: 08-May-1933 MRN: [id number] PRIMARY CARE PROVIDER:    Vidal Schwalbe, MD,  [address] [place] [address] [phone]  REFERRING PROVIDER:   Vidal Schwalbe, MD [address] [place],  [place] [address] [phone]  RESPONSIBLE PARTY:    Contact Information     Name Relation Home Work Gregg Case Daughter [phone]     Gregg Case, Gregg Case   [phone]   Gregg Case, Gregg Case Daughter   [phone]        I met face to face with patient in  home. Palliative Care was asked to follow this patient by consultation request of  Gregg Schwalbe, MD to address advance care planning and complex medical decision making. This is a follow up visit.                                   ASSESSMENT AND PLAN / RECOMMENDATIONS:   Advance Care Planning/Goals of Care: Goals include to maximize quality of life and symptom management. Patient/health care surrogate gave his/her permission to discuss.Our advance care planning conversation included a discussion about:    The value and importance of advance care planning  Experiences with loved ones who have been seriously ill or have died  Exploration of personal, cultural or spiritual beliefs that might influence medical decisions  patient continues to endorse wanting care for easily treatable maladies. 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 he recounts a birthday party at [employer], which he enjoyed.  CODE STATUS: Not on file  Symptom Management/Plan:  I met with patient in his home. It has been six months since our last meeting.  Mobility: He states he's been doing fairly well. He can ambulate in the  home with a cane and denies falls. He is still driving and getting around reasonably well.   Neurogenic bladder: His biggest issues are suprapubic catheter occlusion and frequent UTI. We discussed possibly having the option of irrigating, especially for occlusion. I also recommended cranberry or d-mannose to help manage UTI.   Prevention: Reminded to take Flu and RSV this year, plans to do so this week.   Nutrition- his eating well and however reports some constipation.   Constipation: He states he's using a stool softener. I've recommended senna tablets titrate to effect.   Pain: He endorses, shoulder and knee pain these of our of long-standing duration from.   Denies exercise. He had injections in his shoulder and doubts that a surgical procedure would be wise. I concur and would suggest around the clock Tylenol CR if not, already taking.    Follow up Palliative Care Visit: Palliative care will continue to follow for complex medical decision making, advance care planning, and clarification of goals. Return 12 weeks or prn.  I spent 40 minutes providing this consultation. More than 50% of the time in this consultation was spent in counseling and care coordination.  PPS: 40%  HOSPICE ELIGIBILITY/DIAGNOSIS: TBD  Chief Complaint: pain in R leg  HISTORY OF PRESENT ILLNESS:  Gregg Case. is a 88 y.o. year old male  with urinary retention, frozen shoulder,OA of R  knee . Patient seen today to review palliative care needs to include medical decision making and advance care planning as appropriate.   History obtained from review of EMR, discussion with primary team, and interview with family, facility staff/caregiver and/or Gregg Case.  I reviewed available labs, medications, imaging, studies and related documents from the EMR.  Records reviewed and summarized above.   ROS   General: NAD EYES: denies vision changes ENMT: denies dysphagia Cardiovascular: denies chest pain, denies  DOE Pulmonary: denies cough, denies increased SOB Abdomen: endorses good appetite, endorses  constipation, endorses continence of bowel GU: denies dysuria, endorses continence of urine MSK:  denies  increased weakness,  no falls reported Skin: denies rashes or wounds Neurological: endorses  pain, denies insomnia Psych: Endorses positive mood  Physical Exam: Current and past weights: stable Constitutional: NAD General: frail appearing,  EYES: anicteric sclera, lids intact, no discharge  ENMT: intact hearing, oral mucous membranes moist CV: 1+ bil  LE edema Pulmonary:  no increased work of breathing, no cough, room air Abdomen: intake 100%,  no ascites MSK: mild  sarcopenia, moves all extremities, ambulatory with cane Skin: warm and dry, no rashes or wounds on visible skin Neuro:  + generalized weakness,  no cognitive impairment, non-anxious affect  Thank you for the opportunity to participate in the care of Gregg Case.  The palliative care team will continue to follow. Please call our office at [phone] if we can be of additional assistance.   Jason Coop, NP DNP, AGPCNP-BC  COVID-19 PATIENT SCREENING TOOL Asked and negative response unless otherwise noted:   Have you had symptoms of covid, tested positive or been in contact with someone with symptoms/positive test in the past 5-10 days?
